# Patient Record
Sex: Female | Born: 1960
Health system: Southern US, Community
[De-identification: ages and names within clinical notes are randomized; demographics above are authoritative.]

## PROBLEM LIST (undated history)

## (undated) DIAGNOSIS — Z87442 Personal history of urinary calculi: Secondary | ICD-10-CM

## (undated) DIAGNOSIS — K589 Irritable bowel syndrome without diarrhea: Secondary | ICD-10-CM

## (undated) DIAGNOSIS — Z9889 Other specified postprocedural states: Secondary | ICD-10-CM

## (undated) DIAGNOSIS — IMO0002 Reserved for concepts with insufficient information to code with codable children: Secondary | ICD-10-CM

## (undated) DIAGNOSIS — K219 Gastro-esophageal reflux disease without esophagitis: Secondary | ICD-10-CM

## (undated) DIAGNOSIS — L719 Rosacea, unspecified: Secondary | ICD-10-CM

## (undated) DIAGNOSIS — R112 Nausea with vomiting, unspecified: Secondary | ICD-10-CM

## (undated) DIAGNOSIS — K649 Unspecified hemorrhoids: Secondary | ICD-10-CM

## (undated) DIAGNOSIS — Z8719 Personal history of other diseases of the digestive system: Secondary | ICD-10-CM

## (undated) DIAGNOSIS — K449 Diaphragmatic hernia without obstruction or gangrene: Secondary | ICD-10-CM

## (undated) HISTORY — DX: Rosacea, unspecified: L71.9

## (undated) HISTORY — DX: Unspecified hemorrhoids: K64.9

## (undated) HISTORY — DX: Diaphragmatic hernia without obstruction or gangrene: K44.9

## (undated) HISTORY — PX: COLONOSCOPY: SHX174

## (undated) HISTORY — DX: Gastro-esophageal reflux disease without esophagitis: K21.9

## (undated) HISTORY — PX: OTHER SURGICAL HISTORY: SHX169

## (undated) HISTORY — DX: Irritable bowel syndrome, unspecified: K58.9

## (undated) HISTORY — PX: ABDOMINAL HYSTERECTOMY: SHX81

## (undated) HISTORY — PX: EYE SURGERY: SHX253

## (undated) HISTORY — PX: LASIK: SHX215

## (undated) HISTORY — DX: Reserved for concepts with insufficient information to code with codable children: IMO0002

## (undated) HISTORY — PX: HERNIA REPAIR: SHX51

## (undated) HISTORY — PX: COSMETIC SURGERY: SHX468

---

## 2000-03-01 HISTORY — PX: LAPAROSCOPIC HYSTERECTOMY: SHX1926

## 2005-05-18 ENCOUNTER — Ambulatory Visit: Payer: Self-pay | Admitting: Unknown Physician Specialty

## 2007-06-14 ENCOUNTER — Emergency Department: Payer: Self-pay | Admitting: Emergency Medicine

## 2008-12-31 ENCOUNTER — Ambulatory Visit: Payer: Self-pay | Admitting: Unknown Physician Specialty

## 2009-01-14 ENCOUNTER — Ambulatory Visit: Payer: Self-pay | Admitting: Unknown Physician Specialty

## 2010-03-18 ENCOUNTER — Ambulatory Visit: Payer: Self-pay | Admitting: Unknown Physician Specialty

## 2010-04-03 ENCOUNTER — Ambulatory Visit: Payer: Self-pay | Admitting: Unknown Physician Specialty

## 2011-04-14 ENCOUNTER — Ambulatory Visit: Payer: Self-pay | Admitting: Unknown Physician Specialty

## 2011-04-15 ENCOUNTER — Ambulatory Visit: Payer: Self-pay | Admitting: Unknown Physician Specialty

## 2013-08-14 ENCOUNTER — Encounter: Payer: Self-pay | Admitting: *Deleted

## 2013-09-05 ENCOUNTER — Ambulatory Visit (INDEPENDENT_AMBULATORY_CARE_PROVIDER_SITE_OTHER): Payer: BC Managed Care – PPO | Admitting: General Surgery

## 2013-09-05 ENCOUNTER — Encounter: Payer: Self-pay | Admitting: General Surgery

## 2013-09-05 VITALS — BP 98/56 | HR 68 | Resp 12 | Ht 62.0 in | Wt 157.0 lb

## 2013-09-05 DIAGNOSIS — Z1211 Encounter for screening for malignant neoplasm of colon: Secondary | ICD-10-CM

## 2013-09-05 MED ORDER — POLYETHYLENE GLYCOL 3350 17 GM/SCOOP PO POWD: ORAL | Status: DC

## 2013-09-05 NOTE — Patient Instructions (Addendum)
Colonoscopy A colonoscopy is an exam to look at the entire large intestine (colon). This exam can help find problems such as tumors, polyps, inflammation, and areas of bleeding. The exam takes about 1 hour.  LET [facility]ARE PROVIDER KNOW ABOUT:   Any allergies you have.  All medicines you are taking, including vitamins, herbs, eye drops, creams, and over-the-counter medicines.  Previous problems you or members of your family have had with the use of anesthetics.  Any blood disorders you have.  Previous surgeries you have had.  Medical conditions you have. RISKS AND COMPLICATIONS  Generally, this is a safe procedure. However, as with any procedure, complications can occur. Possible complications include:  Bleeding.  Tearing or rupture of the colon wall.  Reaction to medicines given during the exam.  Infection (rare). BEFORE THE PROCEDURE   Ask your health care provider about changing or stopping your regular medicines.  You may be prescribed an oral bowel prep. This involves drinking a large amount of medicated liquid, starting the day before your procedure. The liquid will cause you to have multiple loose stools until your stool is almost clear or light green. This cleans out your colon in preparation for the procedure.  Do not eat or drink anything else once you have started the bowel prep, unless your health care provider tells you it is safe to do so.  Arrange for someone to drive you home after the procedure. PROCEDURE   You will be given medicine to help you relax (sedative).  You will lie on your side with your knees bent.  A long, flexible tube with a light and camera on the end (colonoscope) will be inserted through the rectum and into the colon. The camera sends video back to a computer screen as it moves through the colon. The colonoscope also releases carbon dioxide gas to inflate the colon. This helps your health care provider see the area better.  During  the exam, your health care provider may take a small tissue sample (biopsy) to be examined under a microscope if any abnormalities are found.  The exam is finished when the entire colon has been viewed. AFTER THE PROCEDURE   Do not drive for 24 hours after the exam.  You may have a small amount of blood in your stool.  You may pass moderate amounts of gas and have mild abdominal cramping or bloating. This is caused by the gas used to inflate your colon during the exam.  Ask when your test results will be ready and how you will get your results. Make sure you get your test results. Document Released: 02/13/2000 Document Revised: 12/06/2012 Document Reviewed: 10/23/2012 [facility] Patient Information 2015 [employer] [place] [place]. This information is not intended to replace advice given to you by your health care provider. Make sure you discuss any questions you have with your health care provider.  Patient has been scheduled [facility] a colon[facility] on 09[facility]-13 [facility].

## 2013-09-05 NOTE — Progress Notes (Signed)
Patient ID: Sharon Branch, female   DOB: 04/23/1960, 53 y.o.   MRN: [id number]  Chief Complaint  Patient presents with  . Colonoscopy    HPI Sharon Branch is a 53 y.o. female.  Here today to discuss having a colonoscopy, none prior. Denies any gastrointestinal issues. She does admit to some rectal bleeding for about 3-4 days from a hemorrhoid about 3-4 weeks ago. This may have happened in the past but not often, no other bleeding since. Denies family history of colon cancer or polys. Bowels move about 1-2 times daily.   HPI  Past Medical History  Diagnosis Date  . IBS (irritable bowel syndrome)   . GERD (gastroesophageal reflux disease)   . Hemorrhoid   . Rosacea     Past Surgical History  Procedure Laterality Date  . Cesarean section  1993  . Laparoscopic hysterectomy  2002    No family history on file.  Social History History  Substance Use Topics  . Smoking status: Never Smoker   . Smokeless tobacco: Never Used  . Alcohol Use: No    Allergies  Allergen Reactions  . Codeine Nausea And Vomiting    Current Outpatient Prescriptions  Medication Sig Dispense Refill  . doxycycline (ORACEA) 40 MG capsule Take 40 mg by mouth every morning.      Marland Kitchen. omeprazole (PRILOSEC) 20 MG capsule Take 20 mg by mouth daily.      . polyethylene glycol powder (GLYCOLAX/MIRALAX) powder 255 grams one bottle for colonoscopy prep  255 g  0   No current facility-administered medications for this visit.    Review of Systems Review of Systems  Constitutional: Negative.   Respiratory: Negative.   Cardiovascular: Negative.   Gastrointestinal: Positive for anal bleeding.    Blood pressure 98/56, pulse 68, resp. rate 12, height 5\' 2"  (1.575 m), weight 157 lb (71.215 kg).  Physical Exam Physical Exam  Constitutional: She is oriented to person, place, and time. She appears well-developed and well-nourished.  Neck: Neck supple.  Cardiovascular: Normal rate, regular rhythm and normal heart  sounds.   Pulmonary/Chest: Effort normal and breath sounds normal.  Lymphadenopathy:    She has no cervical adenopathy.  Neurological: She is alert and oriented to person, place, and time.  Skin: Skin is warm and dry.    Data Reviewed Notes from Dr. Terald SleeperVentolin  Assessment    Candidate for screening colonoscopy.    Plan      Colonoscopy with possible biopsy/polypectomy prn: Information regarding the procedure, including its potential risks and complications (including but not limited to perforation of the bowel, which may require emergency surgery to repair, and bleeding) was verbally given to the patient. Educational information regarding lower instestinal endoscopy was given to the patient. Written instructions for how to complete the bowel prep using Miralax were provided. The importance of drinking ample fluids to avoid dehydration as a result of the prep emphasized.  Patient has been scheduled [facility].     PCP none Ref: Etheleen MayhewVanDalen, Robert  Jetson Pickrel W 09/07/2013, 2:19 PM

## 2013-09-07 DIAGNOSIS — Z1211 Encounter for screening for malignant neoplasm of colon: Secondary | ICD-10-CM | POA: Insufficient documentation

## 2013-09-19 ENCOUNTER — Telehealth: Payer: Self-pay | Admitting: *Deleted

## 2013-09-19 NOTE — Telephone Encounter (Signed)
Per Delice Bisonara at [facility], they do not have enough cases scheduled for 09-25-13 and will need to reschedule this patient's colonoscopy. This case has been moved from 09-25-13 [facility]. Patient aware of date change and verbalizes understanding.

## 2013-10-10 DIAGNOSIS — Z1211 Encounter for screening for malignant neoplasm of colon: Secondary | ICD-10-CM

## 2013-10-10 LAB — HM COLONOSCOPY

## 2013-12-31 ENCOUNTER — Encounter: Payer: Self-pay | Admitting: General Surgery

## 2015-01-16 ENCOUNTER — Other Ambulatory Visit: Payer: Self-pay | Admitting: Student

## 2015-01-16 DIAGNOSIS — R1013 Epigastric pain: Secondary | ICD-10-CM

## 2015-01-16 DIAGNOSIS — R748 Abnormal levels of other serum enzymes: Secondary | ICD-10-CM

## 2015-01-27 ENCOUNTER — Ambulatory Visit
Admission: RE | Admit: 2015-01-27 | Discharge: 2015-01-27 | Disposition: A | Discharge status: Home / Self Care | Payer: BLUE CROSS/BLUE SHIELD | Source: Ambulatory Visit | Attending: Student | Admitting: Student

## 2015-01-27 DIAGNOSIS — R748 Abnormal levels of other serum enzymes: Secondary | ICD-10-CM | POA: Insufficient documentation

## 2015-01-27 DIAGNOSIS — R1013 Epigastric pain: Secondary | ICD-10-CM

## 2016-07-20 NOTE — Telephone Encounter (Signed)
This encounter was created in error - please disregard.

## 2016-08-17 DIAGNOSIS — L718 Other rosacea: Secondary | ICD-10-CM | POA: Diagnosis not present

## 2016-11-02 ENCOUNTER — Telehealth: Payer: Self-pay | Admitting: Family Medicine

## 2016-11-26 NOTE — Telephone Encounter (Signed)
Error

## 2017-01-17 DIAGNOSIS — K3189 Other diseases of stomach and duodenum: Secondary | ICD-10-CM | POA: Diagnosis not present

## 2017-01-17 DIAGNOSIS — K2970 Gastritis, unspecified, without bleeding: Secondary | ICD-10-CM | POA: Diagnosis not present

## 2017-01-17 DIAGNOSIS — R1013 Epigastric pain: Secondary | ICD-10-CM | POA: Diagnosis not present

## 2017-02-17 ENCOUNTER — Other Ambulatory Visit: Payer: Self-pay | Admitting: Student

## 2017-02-17 DIAGNOSIS — R1013 Epigastric pain: Secondary | ICD-10-CM

## 2017-02-17 DIAGNOSIS — R1310 Dysphagia, unspecified: Secondary | ICD-10-CM

## 2017-03-07 ENCOUNTER — Ambulatory Visit: Payer: BLUE CROSS/BLUE SHIELD | Attending: Student

## 2017-04-24 DIAGNOSIS — R509 Fever, unspecified: Secondary | ICD-10-CM | POA: Diagnosis not present

## 2017-04-24 DIAGNOSIS — J09X2 Influenza due to identified novel influenza A virus with other respiratory manifestations: Secondary | ICD-10-CM | POA: Diagnosis not present

## 2017-04-24 DIAGNOSIS — J029 Acute pharyngitis, unspecified: Secondary | ICD-10-CM | POA: Diagnosis not present

## 2017-11-08 DIAGNOSIS — L718 Other rosacea: Secondary | ICD-10-CM | POA: Diagnosis not present

## 2018-01-18 ENCOUNTER — Telehealth: Payer: Self-pay

## 2018-01-18 NOTE — Telephone Encounter (Signed)
Pt calling requesting a RX for nerves/help her sleep. I tried to call pt, no answer. Can you try in a bit to call her and get an appt scheduled?it's been over a year since we have seen her here. Thank you

## 2018-01-19 NOTE — Telephone Encounter (Signed)
Patient is schedule 01/25/18 with AMS

## 2018-01-25 ENCOUNTER — Other Ambulatory Visit (HOSPITAL_COMMUNITY)
Admission: RE | Admit: 2018-01-25 | Discharge: 2018-01-25 | Disposition: A | Discharge status: Home / Self Care | Payer: BLUE CROSS/BLUE SHIELD | Source: Ambulatory Visit | Attending: Obstetrics and Gynecology | Admitting: Obstetrics and Gynecology

## 2018-01-25 ENCOUNTER — Ambulatory Visit (INDEPENDENT_AMBULATORY_CARE_PROVIDER_SITE_OTHER): Payer: BLUE CROSS/BLUE SHIELD | Admitting: Obstetrics and Gynecology

## 2018-01-25 ENCOUNTER — Encounter: Payer: Self-pay | Admitting: Obstetrics and Gynecology

## 2018-01-25 VITALS — BP 127/79 | HR 71 | Ht 61.0 in | Wt 130.0 lb

## 2018-01-25 DIAGNOSIS — Z124 Encounter for screening for malignant neoplasm of cervix: Secondary | ICD-10-CM | POA: Diagnosis not present

## 2018-01-25 DIAGNOSIS — F418 Other specified anxiety disorders: Secondary | ICD-10-CM

## 2018-01-25 DIAGNOSIS — Z01419 Encounter for gynecological examination (general) (routine) without abnormal findings: Secondary | ICD-10-CM | POA: Diagnosis not present

## 2018-01-25 DIAGNOSIS — Z1239 Encounter for other screening for malignant neoplasm of breast: Secondary | ICD-10-CM

## 2018-01-25 MED ORDER — ESCITALOPRAM OXALATE 10 MG PO TABS: 10.0000 mg | ORAL_TABLET | Freq: Every day | ORAL | 3 refills | Status: DC

## 2018-01-25 MED ORDER — HYDROXYZINE HCL 25 MG PO TABS: 25.0000 mg | ORAL_TABLET | Freq: Four times a day (QID) | ORAL | 2 refills | Status: DC | PRN

## 2018-01-25 NOTE — Progress Notes (Signed)
Gynecology Annual Exam  PCP: Patient, No Pcp Per  Chief Complaint:  Chief Complaint  Patient presents with  . Gynecologic Exam    Anxiety going through divorce    History of Present Illness:Patient is a 57 y.o. G2P2 presents for annual exam. The patient has no complaints today.   LMP: No LMP recorded. Patient has had a hysterectomy.  The patient is not currently sexually active.  The patient does perform self breast exams.  There is no notable family history of breast or ovarian cancer in her family.  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 Kyla BalzarineSoper in the past back [facility]    The patient wears seatbelts: yes.   The patient has regular exercise: not asked.    The patient reports current symptoms of depression.     The patient is a 57 y.o. female presenting initial evaluation for symptoms of anxiety and depression.  The patient is currently taking nothing for the management of her symptoms.  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  She reports symptoms of anhedonia, insomnia, irritability, decreased appetite and social anxiety.  She denies risk taking behavior, increased appetite, suicidal ideation, homicidal ideation, auditory hallucinations and visual hallucinations.      The patient does not have a pre-existing history of depression and anxiety.  She  does not a prior history of suicide attempts.    Review of Systems: Review of Systems  Constitutional: Positive for weight loss. Negative for chills, diaphoresis, fever and malaise/fatigue.  HENT: Negative for congestion.   Respiratory: Negative for cough and shortness of breath.   Cardiovascular: Negative for chest pain and palpitations.  Gastrointestinal: Negative for abdominal pain, constipation, diarrhea, heartburn, nausea and vomiting.  Genitourinary: Negative for dysuria, frequency and urgency.  Skin: Negative for itching and  rash.  Neurological: Negative for dizziness and headaches.  Endo/Heme/Allergies: Negative for polydipsia.  Psychiatric/Behavioral: Positive for depression. The patient is nervous/anxious and has insomnia.     Past Medical History:  Past Medical History:  Diagnosis Date  . GERD (gastroesophageal reflux disease)   . Hemorrhoid   . IBS (irritable bowel syndrome)   . Rosacea     Past Surgical History:  Past Surgical History:  Procedure Laterality Date  . CESAREAN SECTION  1993  . LAPAROSCOPIC HYSTERECTOMY  2002    Gynecologic History:  No LMP recorded. Patient has had a hysterectomy. History of VAIN followed by Dr. Kyla BalzarineSoper in the past  Obstetric History: G2P2  Family History:  History reviewed. No pertinent family history.  Social History:  Social History   Socioeconomic History  . Marital status: Married    Spouse name: Not on file  . Number of children: Not on file  . Years of education: Not on file  . Highest education level: Not on file  Occupational History  . Not on file  Social Needs  . Financial resource strain: Not on file  . Food insecurity:    Worry: Not on file    Inability: Not on file  . Transportation needs:    Medical: Not on file    Non-medical: Not on file  Tobacco Use  . Smoking status: Never Smoker  . Smokeless tobacco: Never Used  Substance and Sexual Activity  . Alcohol use: No  . Drug use: No  . Sexual activity: Not on file  Lifestyle  . Physical activity:  Days per week: Not on file    Minutes per session: Not on file  . Stress: Not on file  Relationships  . Social connections:    Talks on phone: Not on file    Gets together: Not on file    Attends religious service: Not on file    Active member of club or organization: Not on file    Attends meetings of clubs or organizations: Not on file    Relationship status: Not on file  . Intimate partner violence:    Fear of current or ex partner: Not on file    Emotionally abused: Not  on file    Physically abused: Not on file    Forced sexual activity: Not on file  Other Topics Concern  . Not on file  Social History Narrative  . Not on file    Allergies:  Allergies  Allergen Reactions  . Codeine Nausea And Vomiting    Medications: Prior to Admission medications   Medication Sig Start Date End Date Taking? Authorizing Provider  doxycycline (ORACEA) 40 MG capsule Take 40 mg by mouth every morning.    [provider]  omeprazole (PRILOSEC) 20 MG capsule Take 20 mg by mouth daily.    [provider]  polyethylene glycol powder (GLYCOLAX/MIRALAX) powder 255 grams one bottle for colonoscopy prep 09/05/13   Earline Mayotte, MD    Physical Exam Vitals: Blood pressure 127/79, pulse 71, height 5\' 1"  (1.549 m), weight 130 lb (59 kg). .  General: NAD HEENT: normocephalic, anicteric Thyroid: no enlargement, no palpable nodules Pulmonary: No increased work of breathing, CTAB Cardiovascular: RRR, distal pulses 2+ Breast: Breast symmetrical, no tenderness, no palpable nodules or masses, no skin or nipple retraction present, no nipple discharge.  No axillary or supraclavicular lymphadenopathy. Abdomen: NABS, soft, non-tender, non-distended.  Umbilicus without lesions.  No hepatomegaly, splenomegaly or masses palpable. No evidence of hernia  Genitourinary:  External: Normal external female genitalia.  Normal urethral meatus, normal Bartholin's and Skene's glands.    Vagina: Normal vaginal mucosa, no evidence of prolapse.    Cervix: surgically absent  Uterus: surgically absent  Adnexa: ovaries non-enlarged, no adnexal masses  Rectal: deferred  Lymphatic: no evidence of inguinal lymphadenopathy Extremities: no edema, erythema, or tenderness Neurologic: Grossly intact Psychiatric: mood appropriate, affect full  Female chaperone present for pelvic and breast  portions of the physical exam     Assessment: 57 y.o. G2P2 routine annual  exam  Plan: Problem List Items Addressed This Visit    None    Visit Diagnoses    Encounter for gynecological examination without abnormal finding    -  Primary   Screening for malignant neoplasm of cervix       Relevant Orders   Cytology - PAP   Breast screening       Relevant Orders   MM 3D SCREEN BREAST BILATERAL      1) Mammogram - recommend yearly screening mammogram.  Mammogram Was ordered today  2) STI screening  was notoffered and therefore not obtained  3) ASCCP guidelines and rational discussed.   screening interval - pap given history of VAIN in past unclear if prior CIN II or greater  4) Osteoporosis  - per USPTF routine screening DEXA at age 14  5) Routine healthcare maintenance including cholesterol, diabetes screening discussed managed by PCP  6) Colonoscopy UTD  Anxiety depression - situational divorce PHQ-9 is 10 item 9 is 0, GAD-7 is 9 - vistaril - lexapro  10mg   7) Return in about 4 years (around 01/25/2022) for 4-6 weeks medication follow up.    Vena Austria, MD Domingo Pulse, [facility] 01/25/2018, 4:28 PM

## 2018-01-25 NOTE — Patient Instructions (Signed)
[facility] [address] [place] [address]  [facility] [place]  [address] [place] [address]  Phone: [phone]  

## 2018-01-31 LAB — CYTOLOGY - PAP: Diagnosis: NEGATIVE

## 2018-03-22 ENCOUNTER — Other Ambulatory Visit: Payer: Self-pay | Admitting: Obstetrics and Gynecology

## 2018-03-22 MED ORDER — ESCITALOPRAM OXALATE 10 MG PO TABS: 10.0000 mg | ORAL_TABLET | Freq: Every day | ORAL | 3 refills | Status: DC

## 2018-03-22 NOTE — Telephone Encounter (Signed)
Reordered if she can make a follow up visit in the next 2-4 weeks to assess how she is doing

## 2018-03-22 NOTE — Telephone Encounter (Signed)
Last appointment 11/27. No future appointments scheduled

## 2018-04-19 ENCOUNTER — Encounter: Payer: Self-pay | Admitting: Obstetrics and Gynecology

## 2018-04-19 ENCOUNTER — Ambulatory Visit (INDEPENDENT_AMBULATORY_CARE_PROVIDER_SITE_OTHER): Payer: BLUE CROSS/BLUE SHIELD | Admitting: Obstetrics and Gynecology

## 2018-04-19 VITALS — BP 116/72 | HR 73 | Ht 61.0 in | Wt 123.0 lb

## 2018-04-19 DIAGNOSIS — F329 Major depressive disorder, single episode, unspecified: Secondary | ICD-10-CM

## 2018-04-19 DIAGNOSIS — F32A Depression, unspecified: Secondary | ICD-10-CM

## 2018-04-19 DIAGNOSIS — F419 Anxiety disorder, unspecified: Secondary | ICD-10-CM

## 2018-04-19 MED ORDER — ESCITALOPRAM OXALATE 10 MG PO TABS: 10.0000 mg | ORAL_TABLET | Freq: Every day | ORAL | 3 refills | Status: DC

## 2018-04-19 NOTE — Progress Notes (Signed)
Obstetrics & Gynecology Office Visit   Chief Complaint:  Chief Complaint  Patient presents with  . Follow-up    Medication    History of Present Illness: The patient is a 58 y.o. female presenting follow up for symptoms of anxiety and depression.  The patient is currently taking Lexapro 10mg  for the management of her symptoms.  She has not had any recent situational stressors.  She reports complete resolution of symptoms, no breakthrough panic attacks.  She denies anhedonia, day time somnolence, insomnia, risk taking behavior, irritability, social anxiety, agorophobia, feelings of guilt, feelings of worthlessness, suicidal ideation, homicidal ideation, auditory hallucinations and visual hallucinations. Symptoms have improved since last visit.     No side-effects noted per patient.  First week had some nausea which resolved.  Review of Systems: Review of Systems  Constitutional: Negative.   Gastrointestinal: Negative for nausea.  Skin: Negative.   Psychiatric/Behavioral: Negative.    Past Medical History:  Past Medical History:  Diagnosis Date  . GERD (gastroesophageal reflux disease)   . Hemorrhoid   . IBS (irritable bowel syndrome)   . Rosacea     Past Surgical History:  Past Surgical History:  Procedure Laterality Date  . CESAREAN SECTION  1993  . LAPAROSCOPIC HYSTERECTOMY  2002    Gynecologic History: No LMP recorded. Patient has had a hysterectomy.  Obstetric History: G2P2  Family History:  History reviewed. No pertinent family history.  Social History:  Social History   Socioeconomic History  . Marital status: Married    Spouse name: Not on file  . Number of children: Not on file  . Years of education: Not on file  . Highest education level: Not on file  Occupational History  . Not on file  Social Needs  . Financial resource strain: Not on file  . Food insecurity:    Worry: Not on file    Inability: Not on file  . Transportation needs:   Medical: Not on file    Non-medical: Not on file  Tobacco Use  . Smoking status: Never Smoker  . Smokeless tobacco: Never Used  Substance and Sexual Activity  . Alcohol use: No  . Drug use: No  . Sexual activity: Not on file  Lifestyle  . Physical activity:    Days per week: Not on file    Minutes per session: Not on file  . Stress: Not on file  Relationships  . Social connections:    Talks on phone: Not on file    Gets together: Not on file    Attends religious service: Not on file    Active member of club or organization: Not on file    Attends meetings of clubs or organizations: Not on file    Relationship status: Not on file  . Intimate partner violence:    Fear of current or ex partner: Not on file    Emotionally abused: Not on file    Physically abused: Not on file    Forced sexual activity: Not on file  Other Topics Concern  . Not on file  Social History Narrative  . Not on file    Allergies:  Allergies  Allergen Reactions  . Codeine Nausea And Vomiting    Medications: Prior to Admission medications   Medication Sig Start Date End Date Taking? Authorizing Provider  doxycycline (ORACEA) 40 MG capsule Take 40 mg by mouth every morning.   Yes [provider]  escitalopram (LEXAPRO) 10 MG tablet Take 1  tablet (10 mg total) by mouth daily. 03/22/18  Yes Vena Austria, MD  hydrOXYzine (ATARAX/VISTARIL) 25 MG tablet Take 1 tablet (25 mg total) by mouth every 6 (six) hours as needed for anxiety. 01/25/18  Yes Vena Austria, MD  polyethylene glycol powder [facility]) powder 255 grams one bottle for colonoscopy prep 09/05/13  Yes Byrnett, Merrily Pew, MD  omeprazole (PRILOSEC) 20 MG capsule Take 20 mg by mouth daily.    [provider]    Physical Exam Vitals:  Vitals:   04/19/18 1346  BP: 116/72  Pulse: 73   No LMP recorded. Patient has had a hysterectomy.  General: NAD HEENT: normocephalic, anicteric Pulmonary: No increased work  of breathing Neurologic: Grossly intact Psychiatric: mood appropriate, affect full    GAD 7 : Generalized Anxiety Score 04/19/2018 01/25/2018  Nervous, Anxious, on Edge 0 2  Control/stop worrying 0 1  Worry too much - different things 1 1  Trouble relaxing 0 2  Restless 0 1  Easily annoyed or irritable 1 1  Afraid - awful might happen 1 0  Total GAD 7 Score 3 8  Anxiety Difficulty Not difficult at all Very difficult    Depression screen [facility] 2/9 04/19/2018 01/25/2018  Decreased Interest 1 1  Down, Depressed, Hopeless 0 2  PHQ - 2 Score 1 3  Altered sleeping 2 2  Tired, decreased energy 1 2  Change in appetite 0 3  Feeling bad or failure about yourself  0 0  Trouble concentrating 0 0  Moving slowly or fidgety/restless 0 0  Suicidal thoughts 0 0  PHQ-9 Score 4 10  Difficult doing work/chores Not difficult at all Somewhat difficult    Depression screen [facility] 2/9 04/19/2018 01/25/2018  Decreased Interest 1 1  Down, Depressed, Hopeless 0 2  PHQ - 2 Score 1 3  Altered sleeping 2 2  Tired, decreased energy 1 2  Change in appetite 0 3  Feeling bad or failure about yourself  0 0  Trouble concentrating 0 0  Moving slowly or fidgety/restless 0 0  Suicidal thoughts 0 0  PHQ-9 Score 4 10  Difficult doing work/chores Not difficult at all Somewhat difficult     Assessment: 58 y.o. G2P2 follow up anxiety and depression with anxiety being the main symptom  Plan: Problem List Items Addressed This Visit    None    Visit Diagnoses    Anxiety and depression    -  Primary   Relevant Medications   escitalopram (LEXAPRO) 10 MG tablet      1) Anxiety/Depression - good response to Lexapro 10mg .  Continue at current dose. - switch to 90 day supply to promote adherence - discussed should she note worsening or return of symptoms at any point to contact office and we would work her in  2) Thyroid and B12 screen has not been obtained previously  3) Return in about 9 months (around  01/18/2019) for annual.    Vena Austria, MD, [facility], [facility] 04/19/2018, 2:04 PM

## 2018-10-24 DIAGNOSIS — D225 Melanocytic nevi of trunk: Secondary | ICD-10-CM | POA: Diagnosis not present

## 2018-10-24 DIAGNOSIS — D2262 Melanocytic nevi of left upper limb, including shoulder: Secondary | ICD-10-CM | POA: Diagnosis not present

## 2018-10-24 DIAGNOSIS — L218 Other seborrheic dermatitis: Secondary | ICD-10-CM | POA: Diagnosis not present

## 2018-10-24 DIAGNOSIS — L718 Other rosacea: Secondary | ICD-10-CM | POA: Diagnosis not present

## 2018-12-06 DIAGNOSIS — M79671 Pain in right foot: Secondary | ICD-10-CM | POA: Diagnosis not present

## 2018-12-06 DIAGNOSIS — M79672 Pain in left foot: Secondary | ICD-10-CM | POA: Diagnosis not present

## 2018-12-06 DIAGNOSIS — M7741 Metatarsalgia, right foot: Secondary | ICD-10-CM | POA: Diagnosis not present

## 2018-12-06 DIAGNOSIS — G5782 Other specified mononeuropathies of left lower limb: Secondary | ICD-10-CM | POA: Diagnosis not present

## 2018-12-27 DIAGNOSIS — G5782 Other specified mononeuropathies of left lower limb: Secondary | ICD-10-CM | POA: Diagnosis not present

## 2018-12-27 DIAGNOSIS — M7741 Metatarsalgia, right foot: Secondary | ICD-10-CM | POA: Diagnosis not present

## 2018-12-27 DIAGNOSIS — M79672 Pain in left foot: Secondary | ICD-10-CM | POA: Diagnosis not present

## 2018-12-27 DIAGNOSIS — M216X1 Other acquired deformities of right foot: Secondary | ICD-10-CM | POA: Diagnosis not present

## 2018-12-27 DIAGNOSIS — M2041 Other hammer toe(s) (acquired), right foot: Secondary | ICD-10-CM | POA: Diagnosis not present

## 2018-12-27 DIAGNOSIS — M216X2 Other acquired deformities of left foot: Secondary | ICD-10-CM | POA: Diagnosis not present

## 2019-04-08 ENCOUNTER — Other Ambulatory Visit: Payer: Self-pay | Admitting: Obstetrics and Gynecology

## 2019-04-09 NOTE — Telephone Encounter (Signed)
Advise

## 2019-07-02 ENCOUNTER — Other Ambulatory Visit: Payer: Self-pay | Admitting: Obstetrics and Gynecology

## 2019-08-02 ENCOUNTER — Other Ambulatory Visit: Payer: Self-pay | Admitting: Obstetrics and Gynecology

## 2019-08-02 ENCOUNTER — Telehealth: Payer: Self-pay | Admitting: Obstetrics and Gynecology

## 2019-08-02 ENCOUNTER — Other Ambulatory Visit: Payer: Self-pay

## 2019-08-02 MED ORDER — ESCITALOPRAM OXALATE 10 MG PO TABS: 10.0000 mg | ORAL_TABLET | Freq: Every day | ORAL | 0 refills | Status: DC

## 2019-08-02 NOTE — Telephone Encounter (Signed)
Rx has been sent  

## 2019-08-02 NOTE — Telephone Encounter (Signed)
See pt msg

## 2019-08-02 NOTE — Telephone Encounter (Signed)
Advise

## 2019-08-02 NOTE — Telephone Encounter (Signed)
PT HAS 5 PILLS LEFT ON HER LEXAPRO. SHE IS SCHEDULED IN [place] NEXT WEEK FOR EAN AND MED FOLLOW UP. CAN A FEW BE CALLED IN TO GET TO HER APPOINTMENT?  PLEASE ADVISE

## 2019-08-03 NOTE — Telephone Encounter (Signed)
Pt is aware Rx was sent in.  

## 2019-08-09 ENCOUNTER — Other Ambulatory Visit: Payer: Self-pay

## 2019-08-09 ENCOUNTER — Encounter: Payer: Self-pay | Admitting: Obstetrics and Gynecology

## 2019-08-09 ENCOUNTER — Ambulatory Visit: Payer: BLUE CROSS/BLUE SHIELD | Admitting: Obstetrics and Gynecology

## 2019-08-09 VITALS — BP 121/79 | HR 59 | Ht 61.0 in | Wt 127.0 lb

## 2019-08-09 DIAGNOSIS — Z1239 Encounter for other screening for malignant neoplasm of breast: Secondary | ICD-10-CM

## 2019-08-09 DIAGNOSIS — Z01419 Encounter for gynecological examination (general) (routine) without abnormal findings: Secondary | ICD-10-CM

## 2019-08-09 MED ORDER — ESCITALOPRAM OXALATE 10 MG PO TABS: 10.0000 mg | ORAL_TABLET | Freq: Every day | ORAL | 3 refills | Status: DC

## 2019-08-09 NOTE — Patient Instructions (Signed)
[facility] [address] [place] [address]  [facility] [place]  [address] [place] [address]  Phone: [phone]  

## 2019-08-09 NOTE — Progress Notes (Signed)
Gynecology Annual Exam  PCP: Sharon Branch, No Pcp Per  Chief Complaint:  Chief Complaint  Sharon Branch presents with  . Gynecologic Exam    History of Present Illness:Sharon Branch is a 59 y.o. G2P2 presents for annual exam. The Sharon Branch has no complaints today.   LMP: No LMP recorded. Sharon Branch has had a hysterectomy.   The Sharon Branch is not currently sexually active.  The Sharon Branch does perform self breast exams.  There is no notable family history of breast or ovarian cancer in her family.  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 Clarene Essex in the past back when he was at [facility].    The Sharon Branch wears seatbelts: yes.   The Sharon Branch has regular exercise: not asked.    The Sharon Branch denies current symptoms of depression.   Doing well on current dose of Lexapro.  Review of Systems: Review of Systems  Constitutional: Negative for chills and fever.  HENT: Negative for congestion.   Respiratory: Negative for cough and shortness of breath.   Cardiovascular: Negative for chest pain and palpitations.  Gastrointestinal: Negative for abdominal pain, constipation, diarrhea, heartburn, nausea and vomiting.  Genitourinary: Negative for dysuria, frequency and urgency.  Skin: Negative for itching and rash.  Neurological: Negative for dizziness and headaches.  Endo/Heme/Allergies: Negative for polydipsia.  Psychiatric/Behavioral: Negative for depression.    Past Medical History:  Sharon Branch Active Problem List   Diagnosis Date Noted  . Encounter for screening colonoscopy 09/07/2013    Past Surgical History:  Past Surgical History:  Procedure Laterality Date  . CESAREAN SECTION  1993  . LAPAROSCOPIC HYSTERECTOMY  2002    Gynecologic History:  No LMP recorded. Sharon Branch has had a hysterectomy. Last Pap: Results were: 01/25/2018 NIL and HR HPV negative   Obstetric History: G2P2  Family History:  History reviewed. No pertinent family history.  Social History:  Social History   Socioeconomic  History  . Marital status: Married    Spouse name: Not on file  . Number of children: Not on file  . Years of education: Not on file  . Highest education level: Not on file  Occupational History  . Not on file  Tobacco Use  . Smoking status: Never Smoker  . Smokeless tobacco: Never Used  Substance and Sexual Activity  . Alcohol use: No  . Drug use: No  . Sexual activity: Not Currently  Other Topics Concern  . Not on file  Social History Narrative  . Not on file   Social Determinants of Health   Financial Resource Strain:   . Difficulty of Paying Living Expenses:   Food Insecurity:   . Worried About Charity fundraiser in the Last Year:   . Arboriculturist in the Last Year:   Transportation Needs:   . Film/video editor (Medical):   Marland Kitchen Lack of Transportation (Non-Medical):   Physical Activity:   . Days of Exercise per Week:   . Minutes of Exercise per Session:   Stress:   . Feeling of Stress :   Social Connections:   . Frequency of Communication with Friends and Family:   . Frequency of Social Gatherings with Friends and Family:   . Attends Religious Services:   . Active Member of Clubs or Organizations:   . Attends Archivist Meetings:   Marland Kitchen Marital Status:   Intimate Partner Violence:   . Fear of Current or Ex-Partner:   . Emotionally Abused:   Marland Kitchen Physically Abused:   .  Sexually Abused:     Allergies:  Allergies  Allergen Reactions  . Codeine Nausea And Vomiting    Medications: Prior to Admission medications   Medication Sig Start Date End Date Taking? Authorizing Provider  doxycycline (ORACEA) 40 MG capsule Take 40 mg by mouth every morning.   Yes [provider]  escitalopram (LEXAPRO) 10 MG tablet Take 1 tablet (10 mg total) by mouth daily. 08/02/19  Yes Vena Austria, MD    Physical Exam Vitals: Blood pressure 121/79, pulse (!) 59, height 5\' 1"  (1.549 m), weight 127 lb (57.6 kg).  General: NAD HEENT: normocephalic,  anicteric Thyroid: no enlargement, no palpable nodules Pulmonary: No increased work of breathing, CTAB Cardiovascular: RRR, distal pulses 2+ Breast: Breast symmetrical, no tenderness, no palpable nodules or masses, no skin or nipple retraction present, no nipple discharge.  No axillary or supraclavicular lymphadenopathy. Abdomen: NABS, soft, non-tender, non-distended.  Umbilicus without lesions.  No hepatomegaly, splenomegaly or masses palpable. No evidence of hernia  Genitourinary:  External: Normal external female genitalia.  Normal urethral meatus, normal Bartholin's and Skene's glands.    Vagina: Normal vaginal mucosa, no evidence of prolapse.    Cervix: surgically absent  Uterus: surgically absent  Adnexa: ovaries non-enlarged, no adnexal masses  Rectal: deferred  Lymphatic: no evidence of inguinal lymphadenopathy Extremities: no edema, erythema, or tenderness Neurologic: Grossly intact Psychiatric: mood appropriate, affect full  Female chaperone present for pelvic and breast  portions of the physical exam     Assessment: 59 y.o. G2P2 routine annual exam  Plan: Problem List Items Addressed This Visit    None    Visit Diagnoses    Encounter for gynecological examination without abnormal finding    -  Primary   Breast screening       Relevant Orders   MM 3D SCREEN BREAST BILATERAL      1) Mammogram - recommend yearly screening mammogram.  Mammogram Was ordered today  2) STI screening  was notoffered and therefore not obtained  3) ASCCP guidelines and rational discussed.  Sharon Branch opts for every 3 years screening interval - continue q3years given history of VAIN 4) Osteoporosis  - per USPTF routine screening DEXA at age 73  5) Routine healthcare maintenance including cholesterol, diabetes screening discussed managed by PCP  6) Colonoscopy - UTD  7) Return in about 1 year (around 08/08/2020) for annual.    10/08/2020, MD Vena Austria, [facility] 08/09/2019, 3:36 PM

## 2019-08-22 NOTE — Telephone Encounter (Signed)
Can this be fixed so we can close out this message. Wont allow [place] to close it because it's unfinished. Thanks

## 2019-12-14 DIAGNOSIS — D225 Melanocytic nevi of trunk: Secondary | ICD-10-CM | POA: Diagnosis not present

## 2019-12-14 DIAGNOSIS — D2262 Melanocytic nevi of left upper limb, including shoulder: Secondary | ICD-10-CM | POA: Diagnosis not present

## 2019-12-14 DIAGNOSIS — L718 Other rosacea: Secondary | ICD-10-CM | POA: Diagnosis not present

## 2019-12-14 DIAGNOSIS — L309 Dermatitis, unspecified: Secondary | ICD-10-CM | POA: Diagnosis not present

## 2020-01-10 ENCOUNTER — Ambulatory Visit: Payer: BC Managed Care – PPO | Admitting: Podiatry

## 2020-01-14 DIAGNOSIS — L719 Rosacea, unspecified: Secondary | ICD-10-CM | POA: Insufficient documentation

## 2020-01-15 ENCOUNTER — Ambulatory Visit: Payer: BC Managed Care – PPO | Admitting: Podiatry

## 2020-01-15 ENCOUNTER — Other Ambulatory Visit: Payer: Self-pay

## 2020-01-15 ENCOUNTER — Encounter: Payer: Self-pay | Admitting: Podiatry

## 2020-01-15 ENCOUNTER — Ambulatory Visit (INDEPENDENT_AMBULATORY_CARE_PROVIDER_SITE_OTHER): Payer: BC Managed Care – PPO

## 2020-01-15 DIAGNOSIS — M779 Enthesopathy, unspecified: Secondary | ICD-10-CM | POA: Diagnosis not present

## 2020-01-15 DIAGNOSIS — M7752 Other enthesopathy of left foot: Secondary | ICD-10-CM

## 2020-01-16 ENCOUNTER — Encounter: Payer: Self-pay | Admitting: Podiatry

## 2020-01-16 NOTE — Progress Notes (Signed)
  Subjective:  Patient ID: Sharon Branch, female    DOB: 03-Apr-1960,  MRN: [id number]  Chief Complaint  Patient presents with  . Foot Pain    Patient presents today for left forefoot pain under toes 2 and 3    59 y.o. female presents with the above complaint.  Patient presents with a complaint of left forefoot pain right under the third metatarsophalangeal joint.  She states that is very painful to touch.  She states she is feels like walking on a pebble.  She states the toes are starting crossover as well.  Is constant pain aches by the end of the day.  Patient has tried orthotics cortisone injection and various other conservative treatment options none of that has helped.  She was being treated at [facility] clinic.  She would like to take discuss her options as she is not getting any better.  She denies any other acute complaints.   Review of Systems: Negative except as noted in the HPI. Denies N/V/F/Ch.  Past Medical History:  Diagnosis Date  . GERD (gastroesophageal reflux disease)   . Hemorrhoid   . IBS (irritable bowel syndrome)   . Rosacea     Current Outpatient Medications:  .  doxycycline (PERIOSTAT) 20 MG tablet, Take by mouth., Disp: , Rfl:  .  Ivermectin (SOOLANTRA) 1 % CREA, APPLY ON FACE EVERY DAY, Disp: , Rfl:  .  escitalopram (LEXAPRO) 10 MG tablet, Take 1 tablet (10 mg total) by mouth daily., Disp: 90 tablet, Rfl: 3  Social History   Tobacco Use  Smoking Status Never Smoker  Smokeless Tobacco Never Used    Allergies  Allergen Reactions  . Codeine Nausea And Vomiting   Objective:  There were no vitals filed for this visit. There is no height or weight on file to calculate BMI. Constitutional Well developed. Well nourished.  Vascular Dorsalis pedis pulses palpable bilaterally. Posterior tibial pulses palpable bilaterally. Capillary refill normal to all digits.  No cyanosis or clubbing noted. Pedal hair growth normal.  Neurologic Normal speech. Oriented to  person, place, and time. Epicritic sensation to light touch grossly present bilaterally.  Dermatologic Nails well groomed and normal in appearance. No open wounds. No skin lesions.  Orthopedic:  Pain on palpation to the left third metatarsophalangeal joint.  Pain with mild range of motion of the third MPJ.  Negative Lachman stress test for plantar plate rupture.  Negative Mulder's click in the second interspace.  No component of neuromas noted   Radiographs: 3 views of skeletally mature the left foot: No osseous abnormality noted.  No concern for stress fracture noted.  No concern for avascular necrosis of the metatarsal head noted.  Medial deviation of the second digit on the metatarsophalangeal joint noted Assessment:   1. Capsulitis    Plan:  Patient was evaluated and treated and all questions answered.  Left third metatarsophalangeal joint capsulitis versus less likely neuroma -I explained to the patient the etiology of capsulitis and various treatment options were discussed given that patient has failed multiple steroid injection without any relief at this time patient will benefit from complete immobilization of the forefoot with a cam boot.  Patient agrees with the plan would like to proceed with immobilization.  If there is no improvement we will discuss getting an MRI to rule out neuroma versus capsulitis.  Patient agrees with the plan. -Cam boot was dispensed  No follow-ups on file.

## 2020-02-12 ENCOUNTER — Ambulatory Visit: Payer: BC Managed Care – PPO | Admitting: Podiatry

## 2020-02-12 ENCOUNTER — Other Ambulatory Visit: Payer: Self-pay

## 2020-02-12 ENCOUNTER — Encounter: Payer: Self-pay | Admitting: Podiatry

## 2020-02-12 DIAGNOSIS — M7752 Other enthesopathy of left foot: Secondary | ICD-10-CM

## 2020-02-12 NOTE — Progress Notes (Signed)
  Subjective:  Patient ID: Sharon Branch, female    DOB: 06-01-1960,  MRN: [id number]  Chief Complaint  Patient presents with  . capsulitis     1 month Follow-up. Feeling much better since last visit. No concerns voiced.     59 y.o. female presents with the above complaint.  Patient presents with complaint of left third metatarsophalangeal joint capsulitis.  Patient states her pain is completely resolved.  She has been doing really well with the cam boot immobilization.  She has also tried some self transition which helped a lot.  She denies any other acute complaints  Review of Systems: Negative except as noted in the HPI. Denies N/V/F/Ch.  Past Medical History:  Diagnosis Date  . GERD (gastroesophageal reflux disease)   . Hemorrhoid   . IBS (irritable bowel syndrome)   . Rosacea     Current Outpatient Medications:  .  doxycycline (PERIOSTAT) 20 MG tablet, Take by mouth., Disp: , Rfl:  .  escitalopram (LEXAPRO) 10 MG tablet, Take 1 tablet (10 mg total) by mouth daily., Disp: 90 tablet, Rfl: 3 .  Ivermectin (SOOLANTRA) 1 % CREA, APPLY ON FACE EVERY DAY, Disp: , Rfl:   Social History   Tobacco Use  Smoking Status Never Smoker  Smokeless Tobacco Never Used    Allergies  Allergen Reactions  . Codeine Nausea And Vomiting   Objective:  There were no vitals filed for this visit. There is no height or weight on file to calculate BMI. Constitutional Well developed. Well nourished.  Vascular Dorsalis pedis pulses palpable bilaterally. Posterior tibial pulses palpable bilaterally. Capillary refill normal to all digits.  No cyanosis or clubbing noted. Pedal hair growth normal.  Neurologic Normal speech. Oriented to person, place, and time. Epicritic sensation to light touch grossly present bilaterally.  Dermatologic Nails well groomed and normal in appearance. No open wounds. No skin lesions.  Orthopedic:  No pain on palpation to the left third metatarsophalangeal joint.  No  pain with mild range of motion of the third MPJ.  Negative Lachman stress test for plantar plate rupture.  Negative Mulder's click in the second interspace.  No component of neuromas noted   Radiographs: 3 views of skeletally mature the left foot: No osseous abnormality noted.  No concern for stress fracture noted.  No concern for avascular necrosis of the metatarsal head noted.  Medial deviation of the second digit on the metatarsophalangeal joint noted Assessment:   1. Capsulitis of metatarsophalangeal (MTP) joint of left foot    Plan:  Patient was evaluated and treated and all questions answered.  Left third metatarsophalangeal joint capsulitis versus less likely neuroma -Clinically healed with cam boot immobilization.  I discussed with her the importance of shoe gear modification as above.  She states understanding and will work on Mining engineer modification. -She already has orthotics and she will place them in the shoes.  I encouraged her to utilize orthotics at all times as this can help with the pressure distribution.  Patient states understanding -I discussed with that if any foot and ankle issues arise in the future come back and see me.  Patient states understanding   No follow-ups on file.

## 2020-05-27 ENCOUNTER — Telehealth: Payer: Self-pay

## 2020-05-27 ENCOUNTER — Ambulatory Visit: Payer: Self-pay | Admitting: Family Medicine

## 2020-05-27 NOTE — Progress Notes (Deleted)
    New patient visit   Patient: Sharon Branch   DOB: 28-Jun-1960   60 y.o. Female  MRN: [id number] Visit Date: 05/27/2020  Today's healthcare provider: Megan Mans, MD   No chief complaint on file.  Subjective    Sharon Branch is a 60 y.o. female who presents today as a new patient to establish care.  HPI  ***  Past Medical History:  Diagnosis Date  . GERD (gastroesophageal reflux disease)   . Hemorrhoid   . IBS (irritable bowel syndrome)   . Rosacea    Past Surgical History:  Procedure Laterality Date  . CESAREAN SECTION  1993  . LAPAROSCOPIC HYSTERECTOMY  2002   Family Status  Relation Name Status  . Mother  Deceased  . Father  Deceased   No family history on file. Social History   Socioeconomic History  . Marital status: Legally Separated    Spouse name: Not on file  . Number of children: Not on file  . Years of education: Not on file  . Highest education level: Not on file  Occupational History  . Not on file  Tobacco Use  . Smoking status: Never Smoker  . Smokeless tobacco: Never Used  Substance and Sexual Activity  . Alcohol use: No  . Drug use: No  . Sexual activity: Not Currently  Other Topics Concern  . Not on file  Social History Narrative  . Not on file   Social Determinants of Health   Financial Resource Strain: Not on file  Food Insecurity: Not on file  Transportation Needs: Not on file  Physical Activity: Not on file  Stress: Not on file  Social Connections: Not on file   Outpatient Medications Prior to Visit  Medication Sig  . doxycycline (PERIOSTAT) 20 MG tablet Take by mouth.  . escitalopram (LEXAPRO) 10 MG tablet Take 1 tablet (10 mg total) by mouth daily.  . Ivermectin (SOOLANTRA) 1 % CREA APPLY ON FACE EVERY DAY   No facility-administered medications prior to visit.   Allergies  Allergen Reactions  . Codeine Nausea And Vomiting    Immunization History  Administered Date(s) Administered  . Influenza,inj,Quad  PF,6+ Mos 12/13/2017    Health Maintenance  Topic Date Due  . Hepatitis C Screening  Never done  . HIV Screening  Never done  . TETANUS/TDAP  Never done  . COLONOSCOPY (Pts 45-52yrs Insurance coverage will need to be confirmed)  Never done  . MAMMOGRAM  Never done  . INFLUENZA VACCINE  09/30/2019  . PAP SMEAR-Modifier  01/25/2021  . HPV VACCINES  Aged Out    Patient Care Team: Patient, No Pcp Per as PCP - General (General Practice) Arvil Chaco, Belia Heman, MD (Unknown Physician Specialty) Earline Mayotte, MD (General Surgery)  Review of Systems  All other systems reviewed and are negative.   {Labs  Heme  Chem  Endocrine  Serology  Results Review (optional):[id number]::" "}  Objective    There were no vitals taken for this visit. Physical Exam ***  Depression Screen PHQ 2/9 Scores 04/19/2018 01/25/2018  PHQ - 2 Score 1 3  PHQ- 9 Score 4 10   No results found for any visits on 05/27/20.  Assessment & Plan     ***  No follow-ups on file.     {provider attestation***:1}   Megan Mans, MD  [facility] [phone] (phone) [phone] (fax)  [facility]

## 2020-05-27 NOTE — Telephone Encounter (Signed)
Copied from CRM [id number]. Topic: General - Other >> May 27, 2020  1:41 PM Gwenlyn Fudge wrote: Reason for CRM: Pt called stating that she has a sore throat and possible head cold. She has a new patient appt with [name] and is requesting to have a call back to reschedule. Please advise.

## 2020-05-27 NOTE — Telephone Encounter (Signed)
Patient has to be established with a provider to treated. Patient has no appointments scheduled with [name].

## 2020-05-27 NOTE — Telephone Encounter (Signed)
Pt had a New Patient appt scheduled with [name] today. It was cancelled by Aida Raider. Pt called with a sore throat and fever stating that she was not sure if she should come in or not. I advised ot that I would leave a message for her and ask someone to call her back to have that rescheduled. She requested to have a call back to be rescheduled. I attempted to reschedule pt, but Dr. Wonda Olds New patient schedule is blocked from [facility]. Please advise.

## 2020-08-19 ENCOUNTER — Other Ambulatory Visit: Payer: Self-pay | Admitting: Obstetrics and Gynecology

## 2020-08-20 NOTE — Telephone Encounter (Signed)
Called and left voicemail for patient to call back to be scheduled. 

## 2020-09-09 ENCOUNTER — Encounter: Payer: Self-pay | Admitting: Family Medicine

## 2020-09-09 ENCOUNTER — Ambulatory Visit: Payer: BC Managed Care – PPO | Admitting: Family Medicine

## 2020-09-09 ENCOUNTER — Other Ambulatory Visit: Payer: Self-pay

## 2020-09-09 VITALS — BP 107/75 | HR 75 | Temp 99.3°F | Resp 16 | Ht 61.0 in | Wt 131.0 lb

## 2020-09-09 DIAGNOSIS — L719 Rosacea, unspecified: Secondary | ICD-10-CM

## 2020-09-09 DIAGNOSIS — F4323 Adjustment disorder with mixed anxiety and depressed mood: Secondary | ICD-10-CM | POA: Diagnosis not present

## 2020-09-09 DIAGNOSIS — K2970 Gastritis, unspecified, without bleeding: Secondary | ICD-10-CM

## 2020-09-09 MED ORDER — OMEPRAZOLE 20 MG PO CPDR: 20.0000 mg | DELAYED_RELEASE_CAPSULE | Freq: Every day | ORAL | 3 refills | Status: DC

## 2020-09-09 NOTE — Progress Notes (Signed)
New patient visit   Patient: Sharon Branch   DOB: 13-Sep-1960   59 y.o. Female  MRN: [id number] Visit Date: 09/09/2020  Today's healthcare provider: Megan Mans, MD   Chief Complaint  Patient presents with   New Patient (Initial Visit)   Gastroesophageal Reflux   Subjective    Sharon Branch is a 60 y.o. female who presents today as a new patient to establish care.   GERD She is having side effects.  She IS experiencing abdominal bloating, belching, chest pain, heartburn, nausea, shortness of breath, and upper abdominal discomfort. She reports that she takes Tums with minimal relief. She has had an endoscopy before in the past, and reports that it was normal. She reports that her symptoms are becoming more frequent.  She is divorced woman who has a 57 year old daughter pursuing a masters degree in art, estate and a 58 year old son who works in Consulting civil engineer.  Past Medical History:  Diagnosis Date   GERD (gastroesophageal reflux disease)    Hemorrhoid    IBS (irritable bowel syndrome)    Rosacea    Past Surgical History:  Procedure Laterality Date   CESAREAN SECTION  1993   LAPAROSCOPIC HYSTERECTOMY  2002   Family Status  Relation Name Status   Mother  Deceased   Father  Deceased   No family history on file. Social History   Socioeconomic History   Marital status: Legally Separated    Spouse name: Not on file   Number of children: Not on file   Years of education: Not on file   Highest education level: Not on file  Occupational History   Not on file  Tobacco Use   Smoking status: Never   Smokeless tobacco: Never  Substance and Sexual Activity   Alcohol use: No   Drug use: No   Sexual activity: Not Currently  Other Topics Concern   Not on file  Social History Narrative   Not on file   Social Determinants of Health   Financial Resource Strain: Not on file  Food Insecurity: Not on file  Transportation Needs: Not on file  Physical Activity: Not on file   Stress: Not on file  Social Connections: Not on file   Outpatient Medications Prior to Visit  Medication Sig   doxycycline (PERIOSTAT) 20 MG tablet Take by mouth.   escitalopram (LEXAPRO) 10 MG tablet TAKE 1 TABLET BY MOUTH EVERY DAY   Ivermectin 1 % CREA APPLY ON FACE EVERY DAY (Patient not taking: No sig reported)   No facility-administered medications prior to visit.   Allergies  Allergen Reactions   Codeine Nausea And Vomiting    Immunization History  Administered Date(s) Administered   Influenza,inj,Quad PF,6+ Mos 12/13/2017    Health Maintenance  Topic Date Due   HIV Screening  Never done   Hepatitis C Screening  Never done   TETANUS/TDAP  Never done   COLONOSCOPY (Pts 45-52yrs Insurance coverage will need to be confirmed)  Never done   MAMMOGRAM  Never done   Zoster Vaccines- Shingrix (1 of 2) Never done   INFLUENZA VACCINE  09/29/2020   PAP SMEAR-Modifier  01/25/2021   Pneumococcal Vaccine 67-48 Years old  Aged Out   HPV VACCINES  Aged Out    Patient Care Team: Patient, No Pcp Per (Inactive) as PCP - General (General Practice) Arvil Chaco, Belia Heman, MD (Unknown Physician Specialty) Earline Mayotte, MD (General Surgery)  Review of Systems  Constitutional:  Negative for activity  change and fatigue.  Respiratory:  Positive for chest tightness and shortness of breath. Negative for cough.   Cardiovascular:  Negative for chest pain, palpitations and leg swelling.  Gastrointestinal:  Positive for abdominal pain and nausea. Negative for constipation, diarrhea, rectal pain and vomiting.  Genitourinary:  Negative for dysuria and frequency.  Musculoskeletal:  Negative for arthralgias, joint swelling and myalgias.  Neurological:  Negative for numbness and headaches.  Psychiatric/Behavioral:  Negative for agitation, self-injury, sleep disturbance and suicidal ideas. The patient is not nervous/anxious.       Objective    BP 107/75   Pulse 75   Temp 99.3 F (37.4 C)    Resp 16   Ht 5\' 1"  (1.549 m)   Wt 131 lb (59.4 kg)   BMI 24.75 kg/m  Physical Exam Vitals reviewed.  Constitutional:      Appearance: Normal appearance.  HENT:     Head: Normocephalic and atraumatic.     Right Ear: External ear normal.     Left Ear: External ear normal.     Nose: Nose normal.  Eyes:     General: No scleral icterus.    Conjunctiva/sclera: Conjunctivae normal.  Cardiovascular:     Rate and Rhythm: Normal rate and regular rhythm.     Pulses: Normal pulses.     Heart sounds: Normal heart sounds.  Pulmonary:     Effort: Pulmonary effort is normal.     Breath sounds: Normal breath sounds.  Abdominal:     Palpations: Abdomen is soft.     Tenderness: There is no abdominal tenderness.  Skin:    General: Skin is warm and dry.  Neurological:     General: No focal deficit present.     Mental Status: She is alert and oriented to person, place, and time.  Psychiatric:        Mood and Affect: Mood normal.        Behavior: Behavior normal.        Thought Content: Thought content normal.        Judgment: Judgment normal.      Depression Screen PHQ 2/9 Scores 09/09/2020 04/19/2018 01/25/2018  PHQ - 2 Score 0 1 3  PHQ- 9 Score - 4 10   No results found for any visits on 09/09/20.  Assessment & Plan      1. Gastritis without bleeding, unspecified chronicity, unspecified gastritis type After discussion with patient we will try omeprazole 20 mg every morning but she is aware that I think the her irritation might be due to her doxycycline for her rosacea although at a low dose.  The timing is such that it started with the onset of taking doxycycline. - omeprazole (PRILOSEC) 20 MG capsule; Take 1 capsule (20 mg total) by mouth daily.  Dispense: 30 capsule; Refill: 3 - CBC with Differential/Platelet - Comprehensive metabolic panel  2. Rosacea On Doxy.  Continued MetroGel or alternative treatment for the stopping for a while.  3. Adjustment disorder with mixed  anxiety and depressed mood On Lexapro.  We will discuss on her next visit.  No follow-ups on file.     I, 11/10/20, MD, have reviewed all documentation for this visit. The documentation on 09/13/20 for the exam, diagnosis, procedures, and orders are all accurate and complete.    Sharon Branch Vanlanen 09/15/20, MD  [facility] [phone] (phone) [phone] (fax)  [facility]

## 2020-09-10 LAB — CBC WITH DIFFERENTIAL/PLATELET
Basophils Absolute: 0 10*3/uL (ref 0.0–0.2)
Basos: 1 %
EOS (ABSOLUTE): 0.1 10*3/uL (ref 0.0–0.4)
Eos: 2 %
Hematocrit: 39.9 % (ref 34.0–46.6)
Hemoglobin: 12.8 g/dL (ref 11.1–15.9)
Immature Grans (Abs): 0 10*3/uL (ref 0.0–0.1)
Immature Granulocytes: 0 %
Lymphocytes Absolute: 1.4 10*3/uL (ref 0.7–3.1)
Lymphs: 25 %
MCH: 26.7 pg (ref 26.6–33.0)
MCHC: 32.1 g/dL (ref 31.5–35.7)
MCV: 83 fL (ref 79–97)
Monocytes Absolute: 0.4 10*3/uL (ref 0.1–0.9)
Monocytes: 7 %
Neutrophils Absolute: 3.8 10*3/uL (ref 1.4–7.0)
Neutrophils: 65 %
Platelets: 291 10*3/uL (ref 150–450)
RBC: 4.79 x10E6/uL (ref 3.77–5.28)
RDW: 14 % (ref 11.7–15.4)
WBC: 5.8 10*3/uL (ref 3.4–10.8)

## 2020-09-10 LAB — COMPREHENSIVE METABOLIC PANEL
ALT: 14 IU/L (ref 0–32)
AST: 21 IU/L (ref 0–40)
Albumin/Globulin Ratio: 2.3 — ABNORMAL HIGH (ref 1.2–2.2)
Albumin: 4.5 g/dL (ref 3.8–4.9)
Alkaline Phosphatase: 167 IU/L — ABNORMAL HIGH (ref 44–121)
BUN/Creatinine Ratio: 36 — ABNORMAL HIGH (ref 9–23)
BUN: 23 mg/dL (ref 6–24)
Bilirubin Total: 0.3 mg/dL (ref 0.0–1.2)
CO2: 23 mmol/L (ref 20–29)
Calcium: 9 mg/dL (ref 8.7–10.2)
Chloride: 99 mmol/L (ref 96–106)
Creatinine, Ser: 0.64 mg/dL (ref 0.57–1.00)
Globulin, Total: 2 g/dL (ref 1.5–4.5)
Glucose: 88 mg/dL (ref 65–99)
Potassium: 4.2 mmol/L (ref 3.5–5.2)
Sodium: 137 mmol/L (ref 134–144)
Total Protein: 6.5 g/dL (ref 6.0–8.5)
eGFR: 102 mL/min/{1.73_m2} (ref >59)

## 2020-09-17 ENCOUNTER — Encounter: Payer: Self-pay | Admitting: Obstetrics and Gynecology

## 2020-09-17 ENCOUNTER — Other Ambulatory Visit: Payer: Self-pay

## 2020-09-17 ENCOUNTER — Ambulatory Visit (INDEPENDENT_AMBULATORY_CARE_PROVIDER_SITE_OTHER): Payer: BC Managed Care – PPO | Admitting: Obstetrics and Gynecology

## 2020-09-17 VITALS — BP 116/72 | HR 62 | Ht 61.0 in | Wt 130.0 lb

## 2020-09-17 DIAGNOSIS — Z01419 Encounter for gynecological examination (general) (routine) without abnormal findings: Secondary | ICD-10-CM

## 2020-09-17 DIAGNOSIS — Z1239 Encounter for other screening for malignant neoplasm of breast: Secondary | ICD-10-CM | POA: Diagnosis not present

## 2020-09-17 DIAGNOSIS — Z1231 Encounter for screening mammogram for malignant neoplasm of breast: Secondary | ICD-10-CM | POA: Diagnosis not present

## 2020-09-17 MED ORDER — ESCITALOPRAM OXALATE 10 MG PO TABS
10.0000 mg | ORAL_TABLET | Freq: Every day | ORAL | 3 refills | Status: DC
Start: 2020-09-17 — End: 2021-11-24

## 2020-09-17 NOTE — Patient Instructions (Signed)
[facility] [address] [place] [address]  [facility] [place]  [address] [place] [address]  Phone: [phone]  

## 2020-09-17 NOTE — Progress Notes (Signed)
Gynecology Annual Exam  PCP: [name]., MD  Chief Complaint:  Chief Complaint  Patient presents with   Annual Exam    History of Present Illness:Patient is a 60 y.o. G2P2 presents for annual exam. The patient has no complaints today.   LMP: No LMP recorded. Patient has had a hysterectomy. No PMB  The patient is not sexually active. She The patient does perform self breast exams.  There is no notable family history of breast or ovarian cancer in her family.  The patient wears seatbelts: yes.   The patient has regular exercise: not asked.    The patient denies current symptoms of depression.  Doing well on current dose of Lexapro.  Review of Systems: Review of Systems  Constitutional:  Negative for chills and fever.  HENT:  Negative for congestion.   Respiratory:  Negative for cough and shortness of breath.   Cardiovascular:  Negative for chest pain and palpitations.  Gastrointestinal:  Negative for abdominal pain, constipation, diarrhea, heartburn, nausea and vomiting.  Genitourinary:  Negative for dysuria, frequency and urgency.  Skin:  Negative for itching and rash.  Neurological:  Negative for dizziness and headaches.  Endo/Heme/Allergies:  Negative for polydipsia.  Psychiatric/Behavioral:  Negative for depression.    Past Medical History:  Patient Active Problem List   Diagnosis Date Noted   Rosacea 01/14/2020   Encounter for screening colonoscopy 09/07/2013    Past Surgical History:  Past Surgical History:  Procedure Laterality Date   CESAREAN SECTION  1993   LAPAROSCOPIC HYSTERECTOMY  2002    Gynecologic History:  No LMP recorded. Patient has had a hysterectomy. Last Pap: Results were: 01/25/2018 NIL and HR HPV negative   Obstetric History: G2P2  Family History:  No family history on file.  Social History:  Social History   Socioeconomic History   Marital status: Legally Separated    Spouse name: Not on file   Number of children:  Not on file   Years of education: Not on file   Highest education level: Not on file  Occupational History   Not on file  Tobacco Use   Smoking status: Never   Smokeless tobacco: Never  Substance and Sexual Activity   Alcohol use: No   Drug use: No   Sexual activity: Not Currently  Other Topics Concern   Not on file  Social History Narrative   Not on file   Social Determinants of Health   Financial Resource Strain: Not on file  Food Insecurity: Not on file  Transportation Needs: Not on file  Physical Activity: Not on file  Stress: Not on file  Social Connections: Not on file  Intimate Partner Violence: Not on file    Allergies:  Allergies  Allergen Reactions   Codeine Nausea And Vomiting    Medications: Prior to Admission medications   Medication Sig Start Date End Date Taking? Authorizing Provider  doxycycline (PERIOSTAT) 20 MG tablet Take by mouth. 11/09/18  Yes [provider]  escitalopram (LEXAPRO) 10 MG tablet TAKE 1 TABLET BY MOUTH EVERY DAY 08/20/20  Yes Vena Austria, MD  omeprazole (PRILOSEC) 20 MG capsule Take 1 capsule (20 mg total) by mouth daily. 09/09/20  Yes [name]., MD  Ivermectin 1 % CREA APPLY ON FACE EVERY DAY Patient not taking: No sig reported 02/17/17   [provider]    Physical Exam Vitals: Blood pressure 116/72, pulse 62, height 5\' 1"  (1.549 m), weight 130 lb (59  kg).  General: NAD HEENT: normocephalic, anicteric Thyroid: no enlargement, no palpable nodules Pulmonary: No increased work of breathing, CTAB Cardiovascular: RRR, distal pulses 2+ Breast: Breast symmetrical, no tenderness, no palpable nodules or masses, no skin or nipple retraction present, no nipple discharge.  No axillary or supraclavicular lymphadenopathy. Abdomen: NABS, soft, non-tender, non-distended.  Umbilicus without lesions.  No hepatomegaly, splenomegaly or masses palpable. No evidence of hernia  Genitourinary:  External: Normal  external female genitalia.  Normal urethral meatus, normal Bartholin's and Skene's glands.    Vagina: Normal vaginal mucosa, no evidence of prolapse.    Cervix: surgically absent  Uterus: surgically absent  Adnexa: ovaries non-enlarged, no adnexal masses  Rectal: deferred  Lymphatic: no evidence of inguinal lymphadenopathy Extremities: no edema, erythema, or tenderness Neurologic: Grossly intact Psychiatric: mood appropriate, affect full  Female chaperone present for pelvic and breast  portions of the physical exam     Assessment: 60 y.o. G2P2 routine annual exam  Plan: Problem List Items Addressed This Visit   None Visit Diagnoses     Encounter for gynecological examination without abnormal finding    -  Primary   Breast screening       Breast cancer screening by mammogram       Relevant Orders   MM 3D SCREEN BREAST BILATERAL       1) Mammogram - recommend yearly screening mammogram.  Mammogram Was ordered today  2) STI screening  was notoffered and therefore not obtained  3) ASCCP guidelines and rational discussed.  Patient opts for every 3 years screening interval  4) Osteoporosis  - per USPTF routine screening DEXA at age 72  5) Routine healthcare maintenance including cholesterol, diabetes screening discussed managed by PCP  6) Colonoscopy UTD  7) Return in about 1 year (around 09/17/2021) for annual.    Vena Austria, MD Domingo Pulse, [facility] 09/17/2020, 2:45 PM

## 2020-09-22 ENCOUNTER — Other Ambulatory Visit: Payer: Self-pay | Admitting: Obstetrics and Gynecology

## 2020-09-22 DIAGNOSIS — Z1231 Encounter for screening mammogram for malignant neoplasm of breast: Secondary | ICD-10-CM

## 2020-09-25 ENCOUNTER — Ambulatory Visit
Admission: RE | Admit: 2020-09-25 | Discharge: 2020-09-25 | Disposition: A | Discharge status: Home / Self Care | Payer: BC Managed Care – PPO | Source: Ambulatory Visit

## 2020-09-25 ENCOUNTER — Other Ambulatory Visit: Payer: Self-pay

## 2020-09-25 DIAGNOSIS — Z1231 Encounter for screening mammogram for malignant neoplasm of breast: Secondary | ICD-10-CM

## 2020-10-12 ENCOUNTER — Encounter: Payer: Self-pay | Admitting: Family Medicine

## 2020-11-12 ENCOUNTER — Ambulatory Visit: Payer: BC Managed Care – PPO | Admitting: Family Medicine

## 2020-11-12 ENCOUNTER — Other Ambulatory Visit: Payer: Self-pay

## 2020-11-12 ENCOUNTER — Encounter: Payer: Self-pay | Admitting: Family Medicine

## 2020-11-12 VITALS — BP 94/59 | HR 62 | Resp 16 | Ht 61.0 in | Wt 133.0 lb

## 2020-11-12 DIAGNOSIS — K219 Gastro-esophageal reflux disease without esophagitis: Secondary | ICD-10-CM

## 2020-11-12 DIAGNOSIS — Z23 Encounter for immunization: Secondary | ICD-10-CM | POA: Diagnosis not present

## 2020-11-12 MED ORDER — FAMOTIDINE 20 MG PO TABS: 20.0000 mg | ORAL_TABLET | Freq: Two times a day (BID) | ORAL | 11 refills | Status: DC

## 2020-11-12 NOTE — Progress Notes (Signed)
I,April Miller,acting as a scribe for Megan Mans, MD.,have documented all relevant documentation on the behalf of Megan Mans, MD,as directed by  Megan Mans, MD while in the presence of Megan Mans, MD.   Established patient visit   Patient: Sharon Branch   DOB: [birth year]   60 y.o. Female  MRN: [id number] Visit Date: 11/12/2020  Today's healthcare provider: Megan Mans, MD   Chief Complaint  Patient presents with   Follow-up   Gastroesophageal Reflux   Subjective    HPI  Still having reflux symptoms off of medication. GERD, Follow up:  The patient was last seen for GERD 2 months ago. Changes made since that visit include starting on Omeprazole 20mg  daily. After about three weeks of taking it, she started having headaches and joint soreness.  The body soreness is still an issue.  It was helping her acid reflux but it made her feel like I have the flu.  She reports fair compliance with treatment.  Patient states after stopping omeprazole, headaches and joint soreness have almost went away. Patient states she is taking TUMS when she has reflux.     Medications: Outpatient Medications Prior to Visit  Medication Sig   doxycycline (PERIOSTAT) 20 MG tablet Take by mouth.   escitalopram (LEXAPRO) 10 MG tablet Take 1 tablet (10 mg total) by mouth daily.   Ivermectin 1 % CREA APPLY ON FACE EVERY DAY (Patient not taking: No sig reported)   omeprazole (PRILOSEC) 20 MG capsule Take 1 capsule (20 mg total) by mouth daily. (Patient not taking: Reported on 11/12/2020)   No facility-administered medications prior to visit.    Review of Systems  Constitutional:  Negative for activity change and fatigue.  Respiratory:  Negative for cough and shortness of breath.   Cardiovascular:  Negative for chest pain, palpitations and leg swelling.  Musculoskeletal:  Positive for arthralgias and myalgias.  Neurological:  Negative for dizziness,  light-headedness and headaches.  Psychiatric/Behavioral:  Negative for self-injury, sleep disturbance and suicidal ideas. The patient is not nervous/anxious.        Objective    BP (!) 94/59 (BP Location: Right Arm, Patient Position: Sitting, Cuff Size: Normal)   Pulse 62   Resp 16   Ht 5\' 1"  (1.549 m)   Wt 133 lb (60.3 kg)   SpO2 99%   BMI 25.13 kg/m  Wt Readings from Last 3 Encounters:  11/12/20 133 lb (60.3 kg)  09/17/20 130 lb (59 kg)  09/09/20 131 lb (59.4 kg)      Physical Exam Vitals reviewed.  Constitutional:      Appearance: Normal appearance.  HENT:     Head: Normocephalic and atraumatic.     Right Ear: External ear normal.     Left Ear: External ear normal.     Nose: Nose normal.  Eyes:     General: No scleral icterus.    Conjunctiva/sclera: Conjunctivae normal.  Cardiovascular:     Rate and Rhythm: Normal rate and regular rhythm.     Pulses: Normal pulses.     Heart sounds: Normal heart sounds.  Pulmonary:     Effort: Pulmonary effort is normal.     Breath sounds: Normal breath sounds.  Abdominal:     Palpations: Abdomen is soft.     Tenderness: There is no abdominal tenderness.  Skin:    General: Skin is warm and dry.  Neurological:     General: No focal deficit present.  Mental Status: She is alert and oriented to person, place, and time.  Psychiatric:        Mood and Affect: Mood normal.        Behavior: Behavior normal.        Thought Content: Thought content normal.        Judgment: Judgment normal.      No results found for any visits on 11/12/20.  Assessment & Plan     1. Gastroesophageal reflux disease, unspecified whether esophagitis present Pepcid twice a day for a month and then as needed.Side effects with PPI. - famotidine (PEPCID) 20 MG tablet; Take 1 tablet (20 mg total) by mouth 2 (two) times daily.  Dispense: 60 tablet; Refill: 11  2. Need for influenza vaccination  - Flu Vaccine QUAD 3mo+IM (Fluarix, Fluzone & Alfiuria  Quad PF)   No follow-ups on file.      I, Megan Mans, MD, have reviewed all documentation for this visit. The documentation on 11/16/20 for the exam, diagnosis, procedures, and orders are all accurate and complete.    Armoni Kludt Wendelyn Breslow, MD  [facility] [phone] (phone) [phone] (fax)  [facility]

## 2021-03-17 ENCOUNTER — Ambulatory Visit: Payer: BC Managed Care – PPO | Admitting: Family Medicine

## 2021-05-06 ENCOUNTER — Encounter: Payer: Self-pay | Admitting: Family Medicine

## 2021-05-06 ENCOUNTER — Other Ambulatory Visit: Payer: Self-pay

## 2021-05-06 ENCOUNTER — Ambulatory Visit: Payer: BC Managed Care – PPO | Admitting: Family Medicine

## 2021-05-06 VITALS — BP 115/78 | HR 59 | Temp 98.0°F | Ht 61.0 in | Wt 130.0 lb

## 2021-05-06 DIAGNOSIS — K2970 Gastritis, unspecified, without bleeding: Secondary | ICD-10-CM

## 2021-05-06 DIAGNOSIS — K219 Gastro-esophageal reflux disease without esophagitis: Secondary | ICD-10-CM

## 2021-05-06 MED ORDER — PANTOPRAZOLE SODIUM 40 MG PO TBEC: 40.0000 mg | DELAYED_RELEASE_TABLET | ORAL | 3 refills | Status: DC

## 2021-05-06 NOTE — Progress Notes (Signed)
?  ? ? ?Established patient visit ? ?I,April Miller,acting as a scribe for Megan Mans, MD.,have documented all relevant documentation on the behalf of Megan Mans, MD,as directed by  Megan Mans, MD while in the presence of Megan Mans, MD. ? ? ? ?Patient: Sharon Branch   DOB: 05/13/1960   61 y.o. Female  MRN: [id number] ?Visit Date: 05/06/2021 ? ?Today's healthcare provider: Megan Mans, MD  ? ?Chief Complaint  ?Patient presents with  ? Follow-up  ? Gastroesophageal Reflux  ? ?Subjective  ?  ?HPI  ?Patient is no better on Pepcid.  She states she did not like taking omeprazole but was not sure what type of side effects she had with it.  She continues to have epigastric distress and reflux heartburn symptoms.  No chest pain.  No shortness of breath.  No weight loss.  No dysphagia. ?GERD, Follow up: ? ?The patient was last seen for GERD 6 months ago. ?Changes made since that visit include; Pepcid twice a day for a month and then as needed. Side effects with PPI. ? ?She reports good compliance with treatment. ?She is not having side effects. none. ? ?She IS experiencing upper abdominal discomfort. ? ?Patient completed 90 days of Pepcid. Patient is still having upper gi pain. ?----------------------------------------------------------------------------------------- ? ? ?Medications: ?Outpatient Medications Prior to Visit  ?Medication Sig  ? escitalopram (LEXAPRO) 10 MG tablet Take 1 tablet (10 mg total) by mouth daily.  ? famotidine (PEPCID) 20 MG tablet Take 1 tablet (20 mg total) by mouth 2 (two) times daily. (Patient not taking: Reported on 05/06/2021)  ? [DISCONTINUED] doxycycline (PERIOSTAT) 20 MG tablet Take by mouth. (Patient not taking: Reported on 05/06/2021)  ? [DISCONTINUED] Ivermectin 1 % CREA APPLY ON FACE EVERY DAY (Patient not taking: Reported on 09/09/2020)  ? [DISCONTINUED] omeprazole (PRILOSEC) 20 MG capsule Take 1 capsule (20 mg total) by mouth daily. (Patient not taking:  Reported on 11/12/2020)  ? ?No facility-administered medications prior to visit.  ? ? ?Review of Systems  ?Constitutional:  Negative for appetite change, chills, fatigue and fever.  ?Respiratory:  Negative for chest tightness and shortness of breath.   ?Cardiovascular:  Negative for chest pain and palpitations.  ?Gastrointestinal:  Negative for abdominal pain, nausea and vomiting.  ?Neurological:  Negative for dizziness and weakness.  ? ?  ?  Objective  ?  ?BP 115/78 (BP Location: Right Arm, Patient Position: Sitting, Cuff Size: Normal)   Pulse (!) 59   Temp 98 ?F (36.7 ?C) (Temporal)   Ht 5\' 1"  (1.549 m)   Wt 130 lb (59 kg)   SpO2 (!) 16%   PF 98 L/min   BMI 24.56 kg/m?  ?Wt Readings from Last 3 Encounters:  ?05/06/21 130 lb (59 kg)  ?11/12/20 133 lb (60.3 kg)  ?09/17/20 130 lb (59 kg)  ? ?  ? ?Physical Exam ?Vitals reviewed.  ?Constitutional:   ?   Appearance: Normal appearance.  ?HENT:  ?   Head: Normocephalic and atraumatic.  ?   Right Ear: External ear normal.  ?   Left Ear: External ear normal.  ?   Nose: Nose normal.  ?Eyes:  ?   General: No scleral icterus. ?   Conjunctiva/sclera: Conjunctivae normal.  ?Cardiovascular:  ?   Rate and Rhythm: Normal rate and regular rhythm.  ?   Pulses: Normal pulses.  ?   Heart sounds: Normal heart sounds.  ?Pulmonary:  ?   Effort: Pulmonary effort is normal.  ?  Breath sounds: Normal breath sounds.  ?Abdominal:  ?   Palpations: Abdomen is soft.  ?   Tenderness: There is no abdominal tenderness.  ?Skin: ?   General: Skin is warm and dry.  ?Neurological:  ?   General: No focal deficit present.  ?   Mental Status: She is alert and oriented to person, place, and time.  ?Psychiatric:     ?   Mood and Affect: Mood normal.     ?   Behavior: Behavior normal.     ?   Thought Content: Thought content normal.     ?   Judgment: Judgment normal.  ?  ? ? ?No results found for any visits on 05/06/21. ? Assessment & Plan  ?  ? ?1. Gastroesophageal reflux disease, unspecified whether  esophagitis present ?Stop Pepcid and try pantoprazole.  Refer to GI for evaluation.  Labs were normal last years when she was having the same symptoms. ?- pantoprazole (PROTONIX) 40 MG tablet; Take 1 tablet (40 mg total) by mouth every morning.  Dispense: 30 tablet; Refill: 3 ? ?2. Gastritis without bleeding, unspecified chronicity, unspecified gastritis type ?Consider adding sucralfate in the near future if she has to wait for any referral ?- Ambulatory referral to Gastroenterology ? ? ?No follow-ups on file.  ?   ? ?I, Megan Mans, MD, have reviewed all documentation for this visit. The documentation on 05/10/21 for the exam, diagnosis, procedures, and orders are all accurate and complete. ? ? ? ?Waunetta Riggle Wendelyn Breslow, MD  ?[facility] ?[phone] (phone) ?[phone] (fax) ? ?[facility] ?

## 2021-05-07 ENCOUNTER — Telehealth: Payer: Self-pay

## 2021-05-07 NOTE — Telephone Encounter (Signed)
Scheduled for 08/05/2021 ?

## 2021-05-07 NOTE — Telephone Encounter (Signed)
CALLED PATIENT NO ANSWER LEFT VOICEMAIL FOR A CALL BACK ? ?

## 2021-05-19 ENCOUNTER — Encounter: Payer: Self-pay | Admitting: Family Medicine

## 2021-08-05 ENCOUNTER — Ambulatory Visit: Payer: BC Managed Care – PPO | Admitting: Gastroenterology

## 2021-08-05 ENCOUNTER — Other Ambulatory Visit: Payer: Self-pay

## 2021-08-05 ENCOUNTER — Encounter: Payer: Self-pay | Admitting: Gastroenterology

## 2021-08-05 VITALS — BP 122/61 | HR 60 | Temp 98.7°F | Ht 61.0 in | Wt 130.5 lb

## 2021-08-05 DIAGNOSIS — G8929 Other chronic pain: Secondary | ICD-10-CM | POA: Diagnosis not present

## 2021-08-05 DIAGNOSIS — R1013 Epigastric pain: Secondary | ICD-10-CM | POA: Diagnosis not present

## 2021-08-05 DIAGNOSIS — R748 Abnormal levels of other serum enzymes: Secondary | ICD-10-CM | POA: Diagnosis not present

## 2021-08-05 NOTE — Progress Notes (Signed)
[name], MD [address]  [address]  [place], [place] [address]  Main: [phone]  Fax: [phone]    Gastroenterology Consultation  Referring Provider:     [name].,* Primary Care Physician:  [name]., MD Primary Gastroenterologist:  Dr. [name] Reason for Consultation: Epigastric pain        HPI:   Sharon Branch is a 61 y.o. female referred by [name], Leonette Monarch., MD  for consultation & management of epigastric pain.  Patient reports that she has been experiencing intermittent episodes of epigastric pain, sensation of food stuck in substernal area, this occurs 1 out of 3 meals a day several times a week.  This has been ongoing for several years.  She underwent ultrasound abdomen in 2016 which was normal.  H. pylori IgG was negative.  She is recently started on Protonix 40 mg p.o. daily and does not seem to help.  Patient is very active, weight has been stable.  She denies any nausea or vomiting.  She does report episodes of regurgitation.  She denies any heartburn.  She denies any radiation of pain.  Her labs are unremarkable except for mildly elevated alkaline phosphatase levels.  Alkaline phosphatase isoenzymes were normal in the past.  Patient does have history of osteoporosis  Patient does not smoke or drink alcohol . NSAIDs: None  Antiplts/Anticoagulants/Anti thrombotics: None  GI Procedures: Reports undergoing colonoscopy within 10 years, reportedly normal Denies any family history of GI malignancy  Past Medical History:  Diagnosis Date   GERD (gastroesophageal reflux disease)    Hemorrhoid    IBS (irritable bowel syndrome)    Rosacea     Past Surgical History:  Procedure Laterality Date   CESAREAN SECTION  1993   LAPAROSCOPIC HYSTERECTOMY  2002    Current Outpatient Medications:    escitalopram (LEXAPRO) 10 MG tablet, Take 1 tablet (10 mg total) by mouth daily., Disp: 90 tablet, Rfl: 3   pantoprazole  (PROTONIX) 40 MG tablet, Take 1 tablet (40 mg total) by mouth every morning., Disp: 30 tablet, Rfl: 3   famotidine (PEPCID) 20 MG tablet, Take 1 tablet (20 mg total) by mouth 2 (two) times daily. (Patient not taking: Reported on 05/06/2021), Disp: 60 tablet, Rfl: 11   Family History  Problem Relation Age of Onset   Breast cancer Mother      Social History   Tobacco Use   Smoking status: Never   Smokeless tobacco: Never  Substance Use Topics   Alcohol use: No   Drug use: No    Allergies as of 08/05/2021 - Review Complete 08/05/2021  Allergen Reaction Noted   Codeine Nausea And Vomiting 09/05/2013    Review of Systems:    All systems reviewed and negative except where noted in HPI.   Physical Exam:  BP 122/61 (BP Location: Left Arm, Patient Position: Sitting, Cuff Size: Normal)   Pulse 60   Temp 98.7 F (37.1 C) (Oral)   Ht 5\' 1"  (1.549 m)   Wt 130 lb 8 oz (59.2 kg)   BMI 24.66 kg/m  No LMP recorded. Patient has had a hysterectomy.  General:   Alert,  Well-developed, well-nourished, pleasant and cooperative in NAD Head:  Normocephalic and atraumatic. Eyes:  Sclera clear, no icterus.   Conjunctiva pink. Ears:  Normal auditory acuity. Nose:  No deformity, discharge, or lesions. Mouth:  No deformity or lesions,oropharynx pink & moist. Neck:  Supple; no masses  or thyromegaly. Lungs:  Respirations even and unlabored.  Clear throughout to auscultation.   No wheezes, crackles, or rhonchi. No acute distress. Heart:  Regular rate and rhythm; no murmurs, clicks, rubs, or gallops. Abdomen:  Normal bowel sounds. Soft, non-tender and non-distended without masses, hepatosplenomegaly or hernias noted.  No guarding or rebound tenderness.   Rectal: Not performed Msk:  Symmetrical without gross deformities. Good, equal movement & strength bilaterally. Pulses:  Normal pulses noted. Extremities:  No clubbing or edema.  No cyanosis. Neurologic:  Alert and oriented x3;  grossly normal  neurologically. Skin:  Intact without significant lesions or rashes. No jaundice. Psych:  Alert and cooperative. Normal mood and affect.  Imaging Studies: Reviewed  Assessment and Plan:   Sharon Branch is a 61 y.o. pleasant Caucasian female with history of osteoporosis is seen in consultation for chronic intermittent episodes of epigastric discomfort, sensation of food stuck in the epigastric area.  Patient denies any other associated symptoms.  No evidence of anemia.  H. pylori antibody was negative in the past.  Normal TTG IgA and total IgA in the past  Recommend EGD for further evaluation If EGD is unremarkable, recommend CT abdomen and pelvis  Isolated elevated alkaline phosphatase levels Alkaline phosphatase isoenzymes are normal Ultrasound liver normal Patient has osteoporosis Can check serum GGT levels   Follow up as needed based on the above work-up   [name], MD

## 2021-08-09 ENCOUNTER — Other Ambulatory Visit: Payer: Self-pay | Admitting: Family Medicine

## 2021-08-09 DIAGNOSIS — K219 Gastro-esophageal reflux disease without esophagitis: Secondary | ICD-10-CM

## 2021-08-10 ENCOUNTER — Encounter: Payer: Self-pay | Admitting: Gastroenterology

## 2021-08-10 ENCOUNTER — Ambulatory Visit: Payer: BC Managed Care – PPO | Admitting: Anesthesiology

## 2021-08-10 ENCOUNTER — Ambulatory Visit
Admission: RE | Admit: 2021-08-10 | Discharge: 2021-08-10 | Disposition: A | Discharge status: Home / Self Care | Payer: BC Managed Care – PPO | Attending: Gastroenterology | Admitting: Gastroenterology

## 2021-08-10 ENCOUNTER — Encounter
Admission: RE | Disposition: A | Discharge status: Home / Self Care | Payer: Self-pay | Source: Home / Self Care | Attending: Gastroenterology

## 2021-08-10 DIAGNOSIS — G8929 Other chronic pain: Secondary | ICD-10-CM | POA: Diagnosis not present

## 2021-08-10 DIAGNOSIS — Z90710 Acquired absence of both cervix and uterus: Secondary | ICD-10-CM | POA: Diagnosis not present

## 2021-08-10 DIAGNOSIS — K589 Irritable bowel syndrome without diarrhea: Secondary | ICD-10-CM | POA: Diagnosis not present

## 2021-08-10 DIAGNOSIS — K449 Diaphragmatic hernia without obstruction or gangrene: Secondary | ICD-10-CM

## 2021-08-10 DIAGNOSIS — R1013 Epigastric pain: Secondary | ICD-10-CM

## 2021-08-10 DIAGNOSIS — K219 Gastro-esophageal reflux disease without esophagitis: Secondary | ICD-10-CM | POA: Diagnosis not present

## 2021-08-10 DIAGNOSIS — R1314 Dysphagia, pharyngoesophageal phase: Secondary | ICD-10-CM | POA: Diagnosis not present

## 2021-08-10 DIAGNOSIS — Z8619 Personal history of other infectious and parasitic diseases: Secondary | ICD-10-CM | POA: Insufficient documentation

## 2021-08-10 HISTORY — PX: ESOPHAGOGASTRODUODENOSCOPY (EGD) WITH PROPOFOL: SHX5813

## 2021-08-10 SURGERY — ESOPHAGOGASTRODUODENOSCOPY (EGD) WITH PROPOFOL
Anesthesia: General

## 2021-08-10 MED ORDER — PROPOFOL 500 MG/50ML IV EMUL
INTRAVENOUS | Status: DC | PRN
  Administered 2021-08-10: 150 ug/kg/min via INTRAVENOUS

## 2021-08-10 MED ORDER — SODIUM CHLORIDE 0.9 % IV SOLN: INTRAVENOUS | Status: DC | PRN

## 2021-08-10 MED ORDER — FENTANYL CITRATE (PF) 100 MCG/2ML IJ SOLN
INTRAMUSCULAR | Status: DC | PRN
  Administered 2021-08-10: 50 ug via INTRAVENOUS

## 2021-08-10 MED ORDER — SODIUM CHLORIDE 0.9 % IV SOLN: INTRAVENOUS | Status: DC

## 2021-08-10 MED ORDER — PROPOFOL 10 MG/ML IV BOLUS
INTRAVENOUS | Status: DC | PRN
  Administered 2021-08-10: 50 mg via INTRAVENOUS

## 2021-08-10 MED ORDER — MIDAZOLAM HCL 2 MG/2ML IJ SOLN
INTRAMUSCULAR | Status: DC | PRN
  Administered 2021-08-10: 2 mg via INTRAVENOUS

## 2021-08-10 MED ORDER — MIDAZOLAM HCL 2 MG/2ML IJ SOLN
INTRAMUSCULAR | Status: AC
  Filled 2021-08-10: qty 2

## 2021-08-10 MED ORDER — FENTANYL CITRATE (PF) 100 MCG/2ML IJ SOLN
INTRAMUSCULAR | Status: AC
  Filled 2021-08-10: qty 2

## 2021-08-10 MED ORDER — LIDOCAINE HCL (CARDIAC) PF 100 MG/5ML IV SOSY
PREFILLED_SYRINGE | INTRAVENOUS | Status: DC | PRN
  Administered 2021-08-10: 50 mg via INTRAVENOUS

## 2021-08-10 NOTE — Op Note (Signed)
[facility] Gastroenterology Patient Name: Sharon Branch Procedure Date: 08/10/2021 10:28 AM MRN: [id number] Account #: [id number] Date of Birth: 09/07/1960 Admit Type: Outpatient Age: 61 [facility] 4 Gender: Female Note Status: Finalized Instrument Name: Upper Endoscope [id number] Procedure:             Upper GI endoscopy Indications:           Esophageal dysphagia Providers:             Toney Reilohini Reddy Jerriah Ines MD, MD Referring MD:          Ferdinand Langoichard L. Sullivan LoneGilbert, MD (Referring MD) Medicines:             General Anesthesia Complications:         No immediate complications. Estimated blood loss: None. Procedure:             Pre-Anesthesia Assessment:                        - Prior to the procedure, a History and Physical was                         performed, and patient medications and allergies were                         reviewed. The patient is competent. The risks and                         benefits of the procedure and the sedation options and                         risks were discussed with the patient. All questions                         were answered and informed consent was obtained.                         Patient identification and proposed procedure were                         verified by the physician, the nurse, the                         anesthesiologist, the anesthetist and the technician                         in the pre-procedure area in the procedure room in the                         endoscopy suite. Mental Status Examination: alert and                         oriented. Airway Examination: normal oropharyngeal                         airway and neck mobility. Respiratory Examination:                         clear to auscultation. CV Examination: normal.  Prophylactic Antibiotics: The patient does not require                         prophylactic antibiotics. Prior Anticoagulants: The                         patient  has taken no previous anticoagulant or                         antiplatelet agents. ASA Grade Assessment: II - A                         patient with mild systemic disease. After reviewing                         the risks and benefits, the patient was deemed in                         satisfactory condition to undergo the procedure. The                         anesthesia plan was to use general anesthesia.                         Immediately prior to administration of medications,                         the patient was re-assessed for adequacy to receive                         sedatives. The heart rate, respiratory rate, oxygen                         saturations, blood pressure, adequacy of pulmonary                         ventilation, and response to care were monitored                         throughout the procedure. The physical status of the                         patient was re-assessed after the procedure.                        After obtaining informed consent, the endoscope was                         passed under direct vision. Throughout the procedure,                         the patient's blood pressure, pulse, and oxygen                         saturations were monitored continuously. The Endoscope                         was introduced through the mouth, and advanced to the  second part of duodenum. The upper GI endoscopy was                         accomplished without difficulty. The patient tolerated                         the procedure well. Findings:      The duodenal bulb and examined duodenum were normal.      A 3 cm paraesophjageal hiatal hernia was found. The proximal extent of       the gastric folds (end of tubular esophagus) was 33 cm from the       incisors. The hiatal narrowing was 36 cm from the incisors. The Z-line       was 33 cm from the incisors.      The stomach was normal.      The cardia and gastric fundus were normal on  retroflexion.      Esophagogastric landmarks were identified: the gastroesophageal junction       was found at 36 cm from the incisors.      The gastroesophageal junction and examined esophagus were normal.       Biopsies were taken with a cold forceps for histology. Impression:            - Normal duodenal bulb and examined duodenum.                        - 3 cm hiatal hernia.                        - Normal stomach.                        - Esophagogastric landmarks identified.                        - Normal gastroesophageal junction and esophagus.                        - No specimens collected. Recommendation:        - Discharge patient to home (with escort).                        - Resume previous diet today.                        - Continue present medications.                        - Await pathology results.                        - Refer to a surgeon at appointment to be scheduled to                         evaluate for hernia repair. Procedure Code(s):     --- Professional ---                        [phone], Esophagogastroduodenoscopy, flexible,                         transoral; diagnostic, including  collection of                         specimen(s) by brushing or washing, when performed                         (separate procedure) Diagnosis Code(s):     --- Professional ---                        K44.9, Diaphragmatic hernia without obstruction or                         gangrene                        R13.14, Dysphagia, pharyngoesophageal phase CPT copyright 2019 American Medical Association. All rights reserved. The codes documented in this report are preliminary and upon coder review may  be revised to meet current compliance requirements. Dr. Libby Maw Toney Reil MD, MD 08/10/2021 10:58:26 AM This report has been signed electronically. Number of Addenda: 0 Note Initiated On: 08/10/2021 10:28 AM Estimated Blood Loss:  Estimated blood loss: none. Estimated  blood loss: none.      [facility]

## 2021-08-10 NOTE — Anesthesia Preprocedure Evaluation (Signed)
Anesthesia Evaluation  Patient identified by MRN, date of birth, ID band Patient awake    Reviewed: Allergy & Precautions, NPO status , Patient's Chart, lab work & pertinent test results  History of Anesthesia Complications (+) PONV and history of anesthetic complications  Airway Mallampati: III  TM Distance: >3 FB Neck ROM: full    Dental  (+) Chipped   Pulmonary neg pulmonary ROS, neg shortness of breath,    Pulmonary exam normal        Cardiovascular Exercise Tolerance: Good (-) angina(-) Past MI negative cardio ROS Normal cardiovascular exam     Neuro/Psych negative neurological ROS  negative psych ROS   GI/Hepatic Neg liver ROS, GERD  Controlled,  Endo/Other  negative endocrine ROS  Renal/GU negative Renal ROS  negative genitourinary   Musculoskeletal   Abdominal   Peds  Hematology negative hematology ROS (+)   Anesthesia Other Findings Past Medical History: No date: GERD (gastroesophageal reflux disease) No date: Hemorrhoid No date: IBS (irritable bowel syndrome) No date: Rosacea  Past Surgical History: 1993: CESAREAN SECTION 2002: LAPAROSCOPIC HYSTERECTOMY  BMI    Body Mass Index: 24.37 kg/m      Reproductive/Obstetrics negative OB ROS                            Anesthesia Physical Anesthesia Plan  ASA: 2  Anesthesia Plan: General   Post-op Pain Management:    Induction: Intravenous  PONV Risk Score and Plan: Propofol infusion and TIVA  Airway Management Planned: Natural Airway and Nasal Cannula  Additional Equipment:   Intra-op Plan:   Post-operative Plan:   Informed Consent: I have reviewed the patients History and Physical, chart, labs and discussed the procedure including the risks, benefits and alternatives for the proposed anesthesia with the patient or authorized representative who has indicated his/her understanding and acceptance.     Dental  Advisory Given  Plan Discussed with: Anesthesiologist, CRNA and Surgeon  Anesthesia Plan Comments: (Patient consented for risks of anesthesia including but not limited to:  - adverse reactions to medications - risk of airway placement if required - damage to eyes, teeth, lips or other oral mucosa - nerve damage due to positioning  - sore throat or hoarseness - Damage to heart, brain, nerves, lungs, other parts of body or loss of life  Patient voiced understanding.)       Anesthesia Quick Evaluation

## 2021-08-10 NOTE — H&P (Signed)
  [name], MD [address]  [address]  [place], [place] [address]  Main: [phone]  Fax: [phone] Pager: [phone]  Primary Care Physician:  [name]., MD Primary Gastroenterologist:  Dr. [name]  Pre-Procedure History & Physical: HPI:  Sharon Branch is a 61 y.o. female is here for an endoscopy.   Past Medical History:  Diagnosis Date   GERD (gastroesophageal reflux disease)    Hemorrhoid    IBS (irritable bowel syndrome)    Rosacea     Past Surgical History:  Procedure Laterality Date   CESAREAN SECTION  1993   LAPAROSCOPIC HYSTERECTOMY  2002    Prior to Admission medications   Medication Sig Start Date End Date Taking? Authorizing Provider  escitalopram (LEXAPRO) 10 MG tablet Take 1 tablet (10 mg total) by mouth daily. 09/17/20   Vena Austria, MD  famotidine (PEPCID) 20 MG tablet Take 1 tablet (20 mg total) by mouth 2 (two) times daily. Patient not taking: Reported on 05/06/2021 11/12/20   [name]., MD  pantoprazole (PROTONIX) 40 MG tablet Take 1 tablet (40 mg total) by mouth every morning. 05/06/21   [name]., MD    Allergies as of 08/05/2021 - Review Complete 08/05/2021  Allergen Reaction Noted   Codeine Nausea And Vomiting 09/05/2013    Family History  Problem Relation Age of Onset   Breast cancer Mother     Social History   Socioeconomic History   Marital status: Legally Separated    Spouse name: Not on file   Number of children: Not on file   Years of education: Not on file   Highest education level: Not on file  Occupational History   Not on file  Tobacco Use   Smoking status: Never   Smokeless tobacco: Never  Vaping Use   Vaping Use: Never used  Substance and Sexual Activity   Alcohol use: No   Drug use: No   Sexual activity: Not Currently  Other Topics Concern   Not on file  Social History Narrative   Not on file   Social Determinants of Health   Financial Resource  Strain: Not on file  Food Insecurity: Not on file  Transportation Needs: Not on file  Physical Activity: Not on file  Stress: Not on file  Social Connections: Not on file  Intimate Partner Violence: Not on file    Review of Systems: See HPI, otherwise negative ROS  Physical Exam: BP 123/69   Pulse (!) 57   Temp (!) 97 F (36.1 C) (Temporal)   Resp 18   Ht 5\' 1"  (1.549 m)   Wt 58.5 kg   SpO2 100%   BMI 24.37 kg/m  General:   Alert,  pleasant and cooperative in NAD Head:  Normocephalic and atraumatic. Neck:  Supple; no masses or thyromegaly. Lungs:  Clear throughout to auscultation.    Heart:  Regular rate and rhythm. Abdomen:  Soft, nontender and nondistended. Normal bowel sounds, without guarding, and without rebound.   Neurologic:  Alert and  oriented x4;  grossly normal neurologically.  Impression/Plan: Sharon Branch is here for an endoscopy to be performed for chronic intermittent episodes of epigastric discomfort, sensation of food stuck in the epigastric area  Risks, benefits, limitations, and alternatives regarding  endoscopy have been reviewed with the patient.  Questions have been answered.  All parties agreeable.   Lucretia Field, MD  08/10/2021, 10:27 AM

## 2021-08-10 NOTE — Transfer of Care (Signed)
Immediate Anesthesia Transfer of Care Note  Patient: Sharon Branch  Procedure(s) Performed: ESOPHAGOGASTRODUODENOSCOPY (EGD) WITH PROPOFOL  Patient Location: PACU and Endoscopy Unit  Anesthesia Type:MAC  Level of Consciousness: drowsy  Airway & Oxygen Therapy: Patient Spontanous Breathing and Patient connected to nasal cannula oxygen  Post-op Assessment: Report given to RN and Post -op Vital signs reviewed and stable  Post vital signs: Reviewed and stable  Last Vitals:  Vitals Value Taken Time  BP    Temp    Pulse    Resp    SpO2      Last Pain:  Vitals:   08/10/21 1000  TempSrc: Temporal  PainSc: 0-No pain         Complications: No notable events documented.

## 2021-08-10 NOTE — Anesthesia Postprocedure Evaluation (Signed)
Anesthesia Post Note  Patient: Sharon Branch  Procedure(s) Performed: ESOPHAGOGASTRODUODENOSCOPY (EGD) WITH PROPOFOL  Patient location during evaluation: Endoscopy Anesthesia Type: General Level of consciousness: awake and alert Pain management: pain level controlled Vital Signs Assessment: post-procedure vital signs reviewed and stable Respiratory status: spontaneous breathing, nonlabored ventilation, respiratory function stable and patient connected to nasal cannula oxygen Cardiovascular status: blood pressure returned to baseline and stable Postop Assessment: no apparent nausea or vomiting Anesthetic complications: no   No notable events documented.   Last Vitals:  Vitals:   08/10/21 1113 08/10/21 1127  BP: (!) 95/51 127/75  Pulse:    Resp:    Temp:    SpO2:      Last Pain:  Vitals:   08/10/21 1127  TempSrc:   PainSc: 0-No pain                 Sharon Branch

## 2021-08-11 ENCOUNTER — Encounter: Payer: Self-pay | Admitting: Gastroenterology

## 2021-08-11 ENCOUNTER — Telehealth: Payer: Self-pay

## 2021-08-11 DIAGNOSIS — K449 Diaphragmatic hernia without obstruction or gangrene: Secondary | ICD-10-CM

## 2021-08-11 LAB — SURGICAL PATHOLOGY

## 2021-08-11 NOTE — Telephone Encounter (Signed)
-----   Message from Lin Landsman, MD sent at 08/11/2021  2:43 PM EDT ----- Recommend referral to general surgery to evaluate for paraesophageal hernia repair Also, please inform patient that the pathology results from upper endoscopy of her stomach came back normal.  Rohini Vanga

## 2021-08-11 NOTE — Telephone Encounter (Signed)
Patient has been notified of pathology results.  Referral has been sent to Dr. Hurman Horn office.  Thanks, [place], [place]

## 2021-08-24 ENCOUNTER — Ambulatory Visit: Payer: BC Managed Care – PPO | Admitting: Surgery

## 2021-08-24 DIAGNOSIS — K449 Diaphragmatic hernia without obstruction or gangrene: Secondary | ICD-10-CM | POA: Diagnosis not present

## 2021-08-26 ENCOUNTER — Encounter: Payer: Self-pay | Admitting: Surgery

## 2021-08-26 NOTE — Progress Notes (Signed)
Patient ID: [name], female   DOB: 01-Jul-1960, 61 y.o.   MRN: [id number]  HPI Sharon Branch is a 61 y.o. female seen in consultation at the request of Dr. Allegra Lai.  She recently underwent upper endoscopy.  Please note that I have personally reviewed the images showing a moderate hiatal hernia seems to be a type I.  She also had biopsy showing no evidence of malignancy. She does experience epigastric pain that is intermittent and mild to moderate intensity and dull type.  She has been started on Protonix with some mixed results.  She also reports that there is a sensation of food stuck in her upper belly and lower chest.  She also experiences significant regurgitation. Prior surgical history includes a hysterectomy and C-section.  She is able to perform more than 4 METS of activity without any shortness of breath or chest pain. She does have a CBC that is normal and BMP and CMP that shows low albumin. She did have RUQ U/S that I have pers, reviewed showing nml GB HPI  Past Medical History:  Diagnosis Date   GERD (gastroesophageal reflux disease)    Hemorrhoid    IBS (irritable bowel syndrome)    Rosacea     Past Surgical History:  Procedure Laterality Date   CESAREAN SECTION  1993   ESOPHAGOGASTRODUODENOSCOPY (EGD) WITH PROPOFOL N/A 08/10/2021   Procedure: ESOPHAGOGASTRODUODENOSCOPY (EGD) WITH PROPOFOL;  Surgeon: Toney Reil, MD;  Location: [facility] ENDOSCOPY;  Service: Gastroenterology;  Laterality: N/A;   LAPAROSCOPIC HYSTERECTOMY  2002    Family History  Problem Relation Age of Onset   Breast cancer Mother     Social History Social History   Tobacco Use   Smoking status: Never   Smokeless tobacco: Never  Vaping Use   Vaping Use: Never used  Substance Use Topics   Alcohol use: No   Drug use: No    Allergies  Allergen Reactions   Codeine Nausea And Vomiting    Current Outpatient Medications  Medication Sig Dispense Refill   escitalopram (LEXAPRO) 10 MG tablet  Take 1 tablet (10 mg total) by mouth daily. 90 tablet 3   pantoprazole (PROTONIX) 40 MG tablet TAKE 1 TABLET BY MOUTH EVERY DAY IN THE MORNING 90 tablet 1   No current facility-administered medications for this visit.     Review of Systems Full ROS  was asked and was negative except for the information on the HPI  Physical Exam There were no vitals taken for this visit. CONSTITUTIONAL: NAD. EYES: Pupils are equal, round, , Sclera are non-icteric. EARS, NOSE, MOUTH AND THROAT:  The oral mucosa is pink and moist. Hearing is intact to voice. LYMPH NODES:  Lymph nodes in the neck are normal. RESPIRATORY:  Lungs are clear. There is normal respiratory effort, with equal breath sounds bilaterally, and without pathologic use of accessory muscles. CARDIOVASCULAR: Heart is regular without murmurs, gallops, or rubs. GI: The abdomen is  soft, nontender, and nondistended. There are no palpable masses. There is no hepatosplenomegaly. There are normal bowel sounds in all quadrants. GU: Rectal deferred.   MUSCULOSKELETAL: Normal muscle strength and tone. No cyanosis or edema.   SKIN: Turgor is good and there are no pathologic skin lesions or ulcers. NEUROLOGIC: Motor and sensation is grossly normal. Cranial nerves are grossly intact. PSYCH:  Oriented to person, place and time. Affect is normal.  Data Reviewed  I have personally reviewed the patient's imaging, laboratory findings and medical records.    Assessment/Plan  Sharon Branch is a 61 year old female with what seems to be a type I paraesophageal hernia with significant symptoms.  Discussed with the patient in detail about her disease process and she wishes to proceed with further work-up.  We will perform a CT scan of the abdomen pelvis as well as a barium swallow.  I do think that from a reflux perspective anatomical perspective it would be reasonable to potentially offer her a robotic repair.  Before entertaining any surgical intervention I will like to  fully assess her anatomy and function of the esophagus.  At this time there is no need for emergent surgical intervention.  A copy of this report was sent to the referring provider.  Please note that I spent 55 minutes in this encounter including personally reviewing imaging studies, records, counseling the patient, placing orders and performing appropriate documentation    Sterling Big, MD FACS General Surgeon 08/26/2021, 9:50 AM

## 2021-09-02 ENCOUNTER — Other Ambulatory Visit
Admission: RE | Admit: 2021-09-02 | Discharge: 2021-09-02 | Disposition: A | Discharge status: Home / Self Care | Payer: BC Managed Care – PPO | Source: Ambulatory Visit | Attending: Surgery | Admitting: Surgery

## 2021-09-02 ENCOUNTER — Ambulatory Visit
Admission: RE | Admit: 2021-09-02 | Discharge: 2021-09-02 | Disposition: A | Discharge status: Home / Self Care | Payer: BC Managed Care – PPO | Source: Ambulatory Visit | Attending: Surgery | Admitting: Surgery

## 2021-09-02 DIAGNOSIS — Z01818 Encounter for other preprocedural examination: Secondary | ICD-10-CM | POA: Diagnosis not present

## 2021-09-02 DIAGNOSIS — K449 Diaphragmatic hernia without obstruction or gangrene: Secondary | ICD-10-CM

## 2021-09-02 DIAGNOSIS — K219 Gastro-esophageal reflux disease without esophagitis: Secondary | ICD-10-CM | POA: Diagnosis not present

## 2021-09-02 LAB — BASIC METABOLIC PANEL
Anion gap: 4 — ABNORMAL LOW (ref 5–15)
BUN: 23 mg/dL — ABNORMAL HIGH (ref 6–20)
CO2: 29 mmol/L (ref 22–32)
Calcium: 8.4 mg/dL — ABNORMAL LOW (ref 8.9–10.3)
Chloride: 107 mmol/L (ref 98–111)
Creatinine, Ser: 0.6 mg/dL (ref 0.44–1.00)
GFR, Estimated: >60 mL/min (ref >60)
Glucose, Bld: 96 mg/dL (ref 70–99)
Potassium: 4.2 mmol/L (ref 3.5–5.1)
Sodium: 140 mmol/L (ref 135–145)

## 2021-09-03 ENCOUNTER — Telehealth: Payer: Self-pay

## 2021-09-03 DIAGNOSIS — K449 Diaphragmatic hernia without obstruction or gangrene: Secondary | ICD-10-CM

## 2021-09-03 NOTE — Telephone Encounter (Signed)
-----   Message from Toney Reil, MD sent at 09/02/2021 12:59 PM EDT ----- Regarding: RE: Morrie Sheldon  Please order esophageal manometry Dx: pre op evaluation for hiatal hernia  Thanks RV ----- Message ----- From: Henrene Dodge, MD Sent: 09/01/2021   7:44 PM EDT To: Toney Reil, MD  Hi Dr. Allegra Lai,  I'm covering for Dr. Everlene Farrier while he's out of country.  He'll be back next week.  I think it'd be helpful to get manometry study please.  Thanks!  Jose   ----- Message ----- From: Toney Reil, MD Sent: 08/26/2021   4:58 PM EDT To: Leafy Ro, MD  Thanks for seeing her.  Do you want her to get esophageal manometry before hernia surgery?  I can arrange that, please let me know  RV ----- Message ----- From: Leafy Ro, MD Sent: 08/26/2021   9:58 AM EDT To: Toney Reil, MD

## 2021-09-03 NOTE — Telephone Encounter (Signed)
Placed referral to [facility] for esophageal manometry

## 2021-09-04 ENCOUNTER — Ambulatory Visit
Admission: RE | Admit: 2021-09-04 | Discharge: 2021-09-04 | Disposition: A | Discharge status: Home / Self Care | Payer: BC Managed Care – PPO | Source: Ambulatory Visit | Attending: Surgery | Admitting: Surgery

## 2021-09-04 DIAGNOSIS — K449 Diaphragmatic hernia without obstruction or gangrene: Secondary | ICD-10-CM | POA: Insufficient documentation

## 2021-09-04 MED ORDER — IOHEXOL 300 MG/ML  SOLN
75.0000 mL | Freq: Once | INTRAMUSCULAR | Status: AC | PRN
Start: 2021-09-04 — End: 2021-09-04
  Administered 2021-09-04: 75 mL via INTRAVENOUS

## 2021-09-10 ENCOUNTER — Telehealth: Payer: Self-pay

## 2021-09-10 ENCOUNTER — Ambulatory Visit
Admission: RE | Admit: 2021-09-10 | Discharge: 2021-09-10 | Disposition: A | Discharge status: Home / Self Care | Payer: BC Managed Care – PPO | Source: Ambulatory Visit | Attending: Surgery | Admitting: Surgery

## 2021-09-10 DIAGNOSIS — K7689 Other specified diseases of liver: Secondary | ICD-10-CM | POA: Diagnosis not present

## 2021-09-10 DIAGNOSIS — K5730 Diverticulosis of large intestine without perforation or abscess without bleeding: Secondary | ICD-10-CM | POA: Diagnosis not present

## 2021-09-10 DIAGNOSIS — K449 Diaphragmatic hernia without obstruction or gangrene: Secondary | ICD-10-CM | POA: Insufficient documentation

## 2021-09-10 MED ORDER — IOHEXOL 300 MG/ML  SOLN
100.0000 mL | Freq: Once | INTRAMUSCULAR | Status: AC | PRN
Start: 2021-09-10 — End: 2021-09-10
  Administered 2021-09-10: 100 mL via INTRAVENOUS

## 2021-09-10 NOTE — Telephone Encounter (Signed)
Patient notified of CT results. Keep scheduled appointment .

## 2021-09-11 ENCOUNTER — Other Ambulatory Visit: Payer: BC Managed Care – PPO

## 2021-09-14 ENCOUNTER — Telehealth: Payer: Self-pay | Admitting: Surgery

## 2021-09-14 ENCOUNTER — Ambulatory Visit: Payer: BC Managed Care – PPO | Admitting: Surgery

## 2021-09-14 ENCOUNTER — Other Ambulatory Visit: Payer: Self-pay

## 2021-09-14 ENCOUNTER — Encounter: Payer: Self-pay | Admitting: Surgery

## 2021-09-14 VITALS — BP 120/75 | HR 57 | Temp 98.1°F | Ht 61.0 in | Wt 133.0 lb

## 2021-09-14 DIAGNOSIS — K449 Diaphragmatic hernia without obstruction or gangrene: Secondary | ICD-10-CM | POA: Diagnosis not present

## 2021-09-14 NOTE — Patient Instructions (Signed)
Our surgery scheduler will call you within 24-48 hours to schedule your surgery. Please have the [place] surgery sheet available when speaking with her.    Eating Plan After Nissen Fundoplication After a Nissen fundoplication procedure, it is common to have some difficulty swallowing. The part of your body that moves food and liquid from your mouth to your stomach (esophagus) will be swollen and may feel tight. It will take several weeks or months for your esophagus and stomach to heal. By following a special eating plan, you can prevent problems such as pain, swelling or pressure in the abdomen (bloating), gas, nausea, or diarrhea. What are tips for following this plan? Cooking Cook all foods until they are soft. Remove skins and seeds from fruits and vegetables before eating. Remove skin and gristle from meats. Grind or finely mince meats before eating. Avoid over-cooking meat. Dry, tough meat is more difficult to swallow. Avoid using oil when cooking, or use only a small amount of oil. Avoid using seasoning when cooking, or use only a small amount of seasoning. Toast bread before eating. This makes it easier to swallow. Meal planning  Eat 6-8 small meals throughout the day. Right after the surgery, have a few meals that are only clear liquids. Clear liquids include: Water. Clear fruit juice, no pulp. Chicken, beef, or vegetable broth. Gelatin. Decaffeinated tea or coffee without milk. Popsicles or shaved ice. Depending on your progress, you may move to a full liquid diet as told by your health care provider. This includes clear liquids and the following: Dairy and alternative milks, such as soy milk. Strained creamed soups. Ice cream or sherbet. Pudding. Nutritional supplement drinks. Yogurt. A few days after surgery, you may be able to start eating a diet of soft foods. You may need to eat according to this plan for several weeks. Do not eat sweets or sweetened drinks at the  beginning of a meal. Doing that may cause your stomach to empty faster than it should (dumping syndrome). Lifestyle Always sit upright when eating or drinking. Eat slowly. Take small bites and chew food well before swallowing. Do not lie down after eating. Stay sitting up for 30 minutes or longer after each meal. Sip fluids between meals. Limit how much you drink at one time. With meals and snacks, have 4-8 oz (120-240 mL). This is equal to  cup-1 cup. Do not mix solid foods and liquids in the same mouthful. Drink enough fluid to keep your urine pale yellow. Do not chew gum or drink fluids through a straw. Doing those things may cause you to swallow extra air. General information Do not drink carbonated drinks or alcohol. Avoid foods and drinks that contain caffeine and chocolate. Avoid foods and drinks that contain citrus or tomato. Allow hot soups and drinks to cool before eating. Avoid foods that cause gas, such as beans, peas, broccoli, or cabbage. If dairy milk products cause diarrhea, avoid them or eat them in small amounts. Recommended foods Fruits Any soft-cooked fruits after skins and seeds are removed. Fruit juice. Vegetables Any soft-cooked vegetables after skins and seeds are removed. Vegetable juice. Grains Cooked cereals. Dry cereals softened with liquid. Cooked pasta, rice, or other grains. Toasted bread. Bland crackers, such as soda or graham crackers. Meats and other protein foods Tender cuts of meat, poultry, or fish after bones, skin, and gristle are removed. Poached, boiled, or scrambled eggs. Canned fish. Tofu. Creamy nut butters. Dairy Milk. Yogurt. Cottage cheese. Mild cheeses. Beverages Nutritional supplement drinks.  Decaffeinated tea or coffee. Sports drinks. Fats and oils Butter. Margarine. Mayonnaise. Vegetable oil. Smooth salad dressing. Sweets and desserts Plain hard candy. Marshmallows. Pudding. Ice cream. Gelatin. Sherbet. Seasoning and other  foods Salt. Light seasonings. Mustard. Vinegar. The items listed above may not be a complete list of recommended foods and beverages. Contact a dietitian for more information. Foods to avoid Fruits Oranges. Grapefruit. Lemons. Limes. Citrus juices. Dried fruit. Crunchy, raw fruits. Vegetables Tomato sauce. Tomato juice. Broccoli. Cauliflower. Cabbage. Brussels sprouts. Crunchy, raw vegetables. Grains High-fiber or bran cereal. Cereal with nuts, dried fruit, or coconut. Sweet breads, rolls, coffee cake, or donuts. Chewy or crusty breads. Popcorn. Meats and other protein foods Beans, peas, and lentils. Tough or fatty meats. Fried meats, chicken, or fish. Fried eggs. Nuts and seeds. Crunchy nut butters. Dairy Chocolate milk. Yogurt with chunks of fruit, nuts, seeds, or coconut. Strong cheeses. Beverages Carbonated soft drinks. Alcohol. Cocoa. Hot drinks. Fats and oils Bacon fat. Lard. Sweets and desserts Chocolate. Candy with nuts, coconut, or seeds. Peppermint. Cookies. Cakes. Pie crust. Seasoning and other foods Heavy seasonings. Chili sauce. Ketchup. Barbecue sauce. Rosita Fire. Horseradish. The items listed above may not be a complete list of foods and beverages to avoid. Contact a dietitian for more information. Summary Following this eating plan after a Nissen fundoplication is an important part of healing after surgery. After surgery, you will start with a clear liquid diet before you progress to full liquids and soft foods. You may need to eat soft foods for several weeks. Avoid eating foods that cause irritation, gas, nausea, diarrhea, or swelling or pressure in the abdomen (bloating), and avoid foods that are difficult to swallow. Talk with a dietitian about which dietary choices are best for you. This information is not intended to replace advice given to you by your health care provider. Make sure you discuss any questions you have with your health care provider. Document Revised:  09/02/2019 Document Reviewed: 09/02/2019 Elsevier Patient Education  2023 [employer] Laparoscopic Nissen Fundoplication  Laparoscopic Nissen fundoplication is a surgery to relieve heartburn and other problems caused by fluid from your stomach (gastric fluids) flowing up into your esophagus. The esophagus is the part of the body that moves food from the mouth to the stomach. Normally, the muscle that sits between the stomach and the esophagus (lower esophageal sphincter, LES) keeps stomach fluids in the stomach. In some people, the LES does not work properly, and stomach fluids flow up into the esophagus (reflux). This can happen when part of the stomach bulges through the LES (hiatal hernia). The backward flow of stomach fluids can cause a type of severe and long-lasting heartburn that is called gastroesophageal reflux disease (GERD). You may need this surgery if other treatments for GERD have not helped. In this procedure, the upper part of your stomach is wrapped around the lower end of your esophagus and stitched together (sutured). This tightens the connection between your esophagus and stomach to prevent stomach acid reflux. Tell a health care provider about: Any allergies you have. All medicines you are taking, including vitamins, herbs, eye drops, creams, and over-the-counter medicines. Any problems you or family members have had with anesthetic medicines. Any blood disorders you have. Any surgeries you have had. Any medical conditions you have. Whether you are pregnant or may be pregnant. What are the risks? Generally, this is a safe procedure. However, problems may occur, including: Infection. Bleeding. Damage to other structures or organs. This can include damage to the lung,  causing a collapsed lung. Trouble swallowing (dysphagia). Blood clots. Allergic reactions to medicines. What happens before the procedure? Staying hydrated Follow instructions from your health care provider  about hydration, which may include: Up to two hours before the procedure - you may continue to drink clear liquids, such as water, clear fruit juice, black coffee and plain tea. Eating and drinking restrictions Follow instructions from your health care provider about eating and drinking, which may include: 8 hours before the procedure - stop eating heavy meals or foods, such as meat, fried foods, or fatty foods. 6 hours before the procedure - stop eating light meals or foods, such as toast or cereal. 6 hours before the procedure - stop drinking milk or drinks that contain milk. 2 hours before the procedure - stop drinking clear liquids. Medicines Ask your health care provider about: Changing or stopping your regular medicines. This is especially important if you are taking diabetes medicines or blood thinners. Taking medicines such as aspirin and ibuprofen. These medicines can thin your blood. Do not take these medicines unless your health care provider tells you to take them. Taking over-the-counter medicines, vitamins, herbs, and supplements. Tests Your health care provider will do tests to plan the procedure. This may include: An exam using a flexible scope passed down your esophagus into your stomach (endoscopy). Imaging studies. General instructions Plan to have a responsible adult take you home from the hospital or clinic. Ask your health care provider: How your surgery site will be marked. What steps will be taken to help prevent infection. These steps may include: Removing hair at the surgery site. Washing skin with a germ-killing soap. Taking antibiotic medicine. Do not use any products that contain nicotine or tobacco for at least 4 weeks before the procedure. These products include cigarettes, chewing tobacco, and vaping devices, such as e-cigarettes. If you need help quitting, ask your health care provider. What happens during the procedure? An IV will be inserted into one of  your veins. You will be given medicine in your IV to help you relax (sedative) just before the procedure and a medicine to make you fall asleep (general anesthetic). You may have a tube placed through your nose into your stomach to drain stomach acid during the procedure (nasogastric tube). The surgeon will make a small incision in your abdomen and insert a tube through the incision. Your abdomen will be filled with a gas. This helps the surgeon see your organs better, and it makes more space to work. The surgeon will insert a thin, lighted tube (laparoscope) through the small incision. This allows your surgeon to see into your abdomen. The surgeon will make several other small incisions in your abdomen to insert the other instruments that are needed during the procedure. Another instrument (dilator) will be passed through your mouth and down your esophagus into the upper part of your stomach. The dilator will prevent your LES from being closed too tightly during surgery. The upper part of your stomach will be wrapped around the lower part of your esophagus and will be stitched into place. This will strengthen the lower esophageal sphincter and prevent reflux. If you have a hiatal hernia, it will be repaired. The gas will be released from your abdomen. All instruments will be removed, and the incisions will be closed with stitches (sutures). A bandage (dressing) will be placed on your skin over the incisions. The procedure may vary among health care providers and hospitals. What happens after the procedure? Your blood  pressure, heart rate, breathing rate, and blood oxygen level will be monitored until you leave the hospital or clinic. You will be given pain medicine as needed. Your IV will be kept in until you are able to drink fluids. You will be encouraged to get up and walk around as soon as possible. Summary Laparoscopic Nissen fundoplication is a surgery to relieve heartburn and other  problems caused by gastric fluids flowing up into your esophagus. You may need this surgery if other treatments for GERD have not helped. Follow instructions from your health care provider about eating and drinking before the procedure. Your surgeon will use a thin, lighted tube (laparoscope) that is inserted through a small incision, allowing the surgeon to see into your abdomen. This information is not intended to replace advice given to you by your health care provider. Make sure you discuss any questions you have with your health care provider. Document Revised: 09/02/2019 Document Reviewed: 09/02/2019 Elsevier Patient Education  2023 [employer].

## 2021-09-14 NOTE — Progress Notes (Signed)
Outpatient Surgical Follow Up  09/14/2021  Sharon Branch is an 61 y.o. female.   Chief Complaint  Patient presents with   Follow-up    Hiatal hernia    HPI: Sharon Branch is a 61-year-old female well-known to me with history of paraesophageal hernia.  She continues to postprandial pain.  Pain is in the epigastric area moderate intensity and sharp.  She can only eat half of her ration.  She has completed appropriate work-up to include barium swallow and CT scan.  Please note that I have personally reviewed the images and shared them with the patient.  There is a type III paraesophageal hernia with the entire stomach within the mediastinum in an upside down stomach configuration. There is no strictures.  She denies any dysphagia.  She is able to perform more than 4 METS of activity without any shortness of breath or chest pain  Past Medical History:  Diagnosis Date   GERD (gastroesophageal reflux disease)    Hemorrhoid    IBS (irritable bowel syndrome)    Rosacea     Past Surgical History:  Procedure Laterality Date   CESAREAN SECTION  1993   ESOPHAGOGASTRODUODENOSCOPY (EGD) WITH PROPOFOL N/A 08/10/2021   Procedure: ESOPHAGOGASTRODUODENOSCOPY (EGD) WITH PROPOFOL;  Surgeon: Vanga, Rohini Reddy, MD;  Location: [facility] ENDOSCOPY;  Service: Gastroenterology;  Laterality: N/A;   LAPAROSCOPIC HYSTERECTOMY  2002    Family History  Problem Relation Age of Onset   Breast cancer Mother     Social History:  reports that she has never smoked. She has never used smokeless tobacco. She reports that she does not drink alcohol and does not use drugs.  Allergies:  Allergies  Allergen Reactions   Codeine Nausea And Vomiting    Medications reviewed.    ROS Full ROS performed and is otherwise negative other than what is stated in HPI   BP 120/75   Pulse (!) 57   Temp 98.1 F (36.7 C) (Oral)   Ht 5' 1" (1.549 m)   Wt 133 lb (60.3 kg)   SpO2 99%   BMI 25.13 kg/m   Physical Exam Vitals and  nursing note reviewed. Exam conducted with a chaperone present.  Constitutional:      General: She is not in acute distress.    Appearance: Normal appearance. She is not ill-appearing.  Eyes:     General: No scleral icterus.       Right eye: No discharge.        Left eye: No discharge.  Cardiovascular:     Rate and Rhythm: Normal rate and regular rhythm.     Heart sounds: No murmur heard.    No friction rub.  Pulmonary:     Effort: Pulmonary effort is normal. No respiratory distress.     Breath sounds: Normal breath sounds. No stridor.  Abdominal:     General: Abdomen is flat. There is no distension.     Palpations: Abdomen is soft. There is no mass.     Tenderness: There is no abdominal tenderness. There is no guarding or rebound.     Hernia: No hernia is present.  Musculoskeletal:     Cervical back: Normal range of motion and neck supple. No rigidity or tenderness.  Lymphadenopathy:     Cervical: No cervical adenopathy.  Skin:    General: Skin is warm and dry.     Capillary Refill: Capillary refill takes less than 2 seconds.  Neurological:     General: No focal deficit present.       Mental Status: She is alert and oriented to person, place, and time.  Psychiatric:        Mood and Affect: Mood normal.        Behavior: Behavior normal.        Thought Content: Thought content normal.        Judgment: Judgment normal.    Assessment/Plan: Sharon Branch is a 61 year old female with  paraesophageal hernia type III w upside down stomach with significant symptoms. I Discussed with the patient in detail about her disease process and I do definitely recommend repair of days symptomatic paraesophageal hernia.  I do think we can still do it robotically.  Procedure discussed with the patient in detail.  Risks, benefits and possible implications including but not limited to: Bleeding, infection, pneumothorax, bowel or esophageal injuries, chronic pain and bloating.  Discussed with her about diet  modification postoperatively She understands and wishes to proceed.  We will plan for repair in about 2 to 3 weeks Please note that I spent 45 minutes in this encounter including personally reviewing imaging studies, records, counseling the patient, placing orders and performing appropriate documentation    Sterling Big, MD [facility] General Surgeon

## 2021-09-14 NOTE — Telephone Encounter (Signed)
Patient has been advised of Pre-Admission date/time, and Surgery date.  Surgery Date: 10/01/21 Preadmission Testing Date: 09/21/21 (phone 1p-5p)  Patient has been made aware to call [phone], between 1-3:00pm the day before surgery, to find out what time to arrive for surgery.

## 2021-09-14 NOTE — H&P (View-Only) (Signed)
Outpatient Surgical Follow Up  09/14/2021  Sharon Branch is an 61 y.o. female.   Chief Complaint  Patient presents with   Follow-up    Hiatal hernia    HPI: Sharon Branch is a 61 year old female well-known to me with history of paraesophageal hernia.  She continues to postprandial pain.  Pain is in the epigastric area moderate intensity and sharp.  She can only eat half of her ration.  She has completed appropriate work-up to include barium swallow and CT scan.  Please note that I have personally reviewed the images and shared them with the patient.  There is a type III paraesophageal hernia with the entire stomach within the mediastinum in an upside down stomach configuration. There is no strictures.  She denies any dysphagia.  She is able to perform more than 4 METS of activity without any shortness of breath or chest pain  Past Medical History:  Diagnosis Date   GERD (gastroesophageal reflux disease)    Hemorrhoid    IBS (irritable bowel syndrome)    Rosacea     Past Surgical History:  Procedure Laterality Date   CESAREAN SECTION  1993   ESOPHAGOGASTRODUODENOSCOPY (EGD) WITH PROPOFOL N/A 08/10/2021   Procedure: ESOPHAGOGASTRODUODENOSCOPY (EGD) WITH PROPOFOL;  Surgeon: Toney Reil, MD;  Location: [facility] ENDOSCOPY;  Service: Gastroenterology;  Laterality: N/A;   LAPAROSCOPIC HYSTERECTOMY  2002    Family History  Problem Relation Age of Onset   Breast cancer Mother     Social History:  reports that she has never smoked. She has never used smokeless tobacco. She reports that she does not drink alcohol and does not use drugs.  Allergies:  Allergies  Allergen Reactions   Codeine Nausea And Vomiting    Medications reviewed.    ROS Full ROS performed and is otherwise negative other than what is stated in HPI   BP 120/75   Pulse (!) 57   Temp 98.1 F (36.7 C) (Oral)   Ht 5\' 1"  (1.549 m)   Wt 133 lb (60.3 kg)   SpO2 99%   BMI 25.13 kg/m   Physical Exam Vitals and  nursing note reviewed. Exam conducted with a chaperone present.  Constitutional:      General: She is not in acute distress.    Appearance: Normal appearance. She is not ill-appearing.  Eyes:     General: No scleral icterus.       Right eye: No discharge.        Left eye: No discharge.  Cardiovascular:     Rate and Rhythm: Normal rate and regular rhythm.     Heart sounds: No murmur heard.    No friction rub.  Pulmonary:     Effort: Pulmonary effort is normal. No respiratory distress.     Breath sounds: Normal breath sounds. No stridor.  Abdominal:     General: Abdomen is flat. There is no distension.     Palpations: Abdomen is soft. There is no mass.     Tenderness: There is no abdominal tenderness. There is no guarding or rebound.     Hernia: No hernia is present.  Musculoskeletal:     Cervical back: Normal range of motion and neck supple. No rigidity or tenderness.  Lymphadenopathy:     Cervical: No cervical adenopathy.  Skin:    General: Skin is warm and dry.     Capillary Refill: Capillary refill takes less than 2 seconds.  Neurological:     General: No focal deficit present.  Mental Status: She is alert and oriented to person, place, and time.  Psychiatric:        Mood and Affect: Mood normal.        Behavior: Behavior normal.        Thought Content: Thought content normal.        Judgment: Judgment normal.    Assessment/Plan: Sharon Branch is a 61 year old female with  paraesophageal hernia type III w upside down stomach with significant symptoms. I Discussed with the patient in detail about her disease process and I do definitely recommend repair of days symptomatic paraesophageal hernia.  I do think we can still do it robotically.  Procedure discussed with the patient in detail.  Risks, benefits and possible implications including but not limited to: Bleeding, infection, pneumothorax, bowel or esophageal injuries, chronic pain and bloating.  Discussed with her about diet  modification postoperatively She understands and wishes to proceed.  We will plan for repair in about 2 to 3 weeks Please note that I spent 45 minutes in this encounter including personally reviewing imaging studies, records, counseling the patient, placing orders and performing appropriate documentation    Sterling Big, MD [facility] General Surgeon

## 2021-09-21 ENCOUNTER — Encounter
Admission: RE | Admit: 2021-09-21 | Discharge: 2021-09-21 | Disposition: A | Discharge status: Home / Self Care | Payer: BC Managed Care – PPO | Source: Ambulatory Visit | Attending: Surgery | Admitting: Surgery

## 2021-09-21 HISTORY — DX: Personal history of urinary calculi: Z87.442

## 2021-09-21 HISTORY — DX: Personal history of other diseases of the digestive system: Z87.19

## 2021-09-21 HISTORY — DX: Other specified postprocedural states: R11.2

## 2021-09-21 HISTORY — DX: Other specified postprocedural states: Z98.890

## 2021-09-21 NOTE — Patient Instructions (Signed)
Your procedure is scheduled on:10-01-21 Thursday Report to the Registration Desk on the 1st floor of the [facility].Then proceed to the 2nd floor Surgery Desk To find out your arrival time, please call [phone] between 1PM - 3PM on:09-30-21 Wednesday If your arrival time is 6:00 am, do not arrive prior to that time as the [facility] entrance doors do not open until 6:00 am.  REMEMBER: Instructions that are not followed completely may result in serious medical risk, up to and including death; or upon the discretion of your surgeon and anesthesiologist your surgery may need to be rescheduled.  Do not eat food after midnight the night before surgery.  No gum chewing, lozengers or hard candies.  You may however, drink CLEAR liquids up to 2 hours before you are scheduled to arrive for your surgery. Do not drink anything within 2 hours of your scheduled arrival time.  Clear liquids include: - water  - apple juice without pulp - gatorade (not RED colors) - black coffee or tea (Do NOT add milk or creamers to the coffee or tea) Do NOT drink anything that is not on this list.  Do NOT take any medication the day of surgery  One week prior to surgery: Stop Anti-inflammatories (NSAIDS) such as Advil, Aleve, Ibuprofen, Motrin, Naproxen, Naprosyn and Aspirin based products such as Excedrin, Goodys Powder, BC Powder.You may however, take Tylenol if needed for pain up until the day of surgery.  Stop ANY OVER THE COUNTER supplements/vitamins 7 days prior to surgery   No Alcohol for 24 hours before or after surgery.  No Smoking including e-cigarettes for 24 hours prior to surgery.  No chewable tobacco products for at least 6 hours prior to surgery.  No nicotine patches on the day of surgery.  Do not use any "recreational" drugs for at least a week prior to your surgery.  Please be advised that the combination of cocaine and anesthesia may have negative outcomes, up to and including death. If  you test positive for cocaine, your surgery will be cancelled.  On the morning of surgery brush your teeth with toothpaste and water, you may rinse your mouth with mouthwash if you wish. Do not swallow any toothpaste or mouthwash.  Use CHG Soap as directed on instruction sheet.  Do not wear jewelry, make-up, hairpins, clips or nail polish.  Do not wear lotions, powders, or perfumes.   Do not shave body from the neck down 48 hours prior to surgery just in case you cut yourself which could leave a site for infection.  Also, freshly shaved skin may become irritated if using the CHG soap.  Contact lenses, hearing aids and dentures may not be worn into surgery.  Do not bring valuables to the hospital. [facility] is not responsible for any missing/lost belongings or valuables.   Notify your doctor if there is any change in your medical condition (cold, fever, infection).  Wear comfortable clothing (specific to your surgery type) to the hospital.  After surgery, you can help prevent lung complications by doing breathing exercises.  Take deep breaths and cough every 1-2 hours. Your doctor may order a device called an Incentive Spirometer to help you take deep breaths. When coughing or sneezing, hold a pillow firmly against your incision with both hands. This is called "splinting." Doing this helps protect your incision. It also decreases belly discomfort.  If you are being admitted to the hospital overnight, leave your suitcase in the car. After surgery it may  be brought to your room.  If you are being discharged the day of surgery, you will not be allowed to drive home. You will need a responsible adult (18 years or older) to drive you home and stay with you that night.   If you are taking public transportation, you will need to have a responsible adult (18 years or older) with you. Please confirm with your physician that it is acceptable to use public transportation.   Please call the  [place] [facility] at [phone] if you have any questions about these instructions.  Surgery Visitation Policy:  Patients undergoing a surgery or procedure may have two family members or support persons with them as long as the person is not COVID-19 positive or experiencing its symptoms.   Inpatient Visitation:    Visiting hours are 7 a.m. to 8 p.m. Up to four visitors are allowed at one time in a patient room, including children. The visitors may rotate out with other people during the day. One designated support person (adult) may remain overnight.

## 2021-09-30 MED ORDER — CELECOXIB 200 MG PO CAPS: 200.0000 mg | ORAL_CAPSULE | ORAL | Status: AC

## 2021-09-30 MED ORDER — ACETAMINOPHEN 500 MG PO TABS: 1000.0000 mg | ORAL_TABLET | ORAL | Status: AC

## 2021-09-30 MED ORDER — CHLORHEXIDINE GLUCONATE CLOTH 2 % EX PADS
6.0000 | MEDICATED_PAD | Freq: Once | CUTANEOUS | Status: AC
  Administered 2021-10-01: 6 via TOPICAL

## 2021-09-30 MED ORDER — CHLORHEXIDINE GLUCONATE 0.12 % MT SOLN: 15.0000 mL | Freq: Once | OROMUCOSAL | Status: AC

## 2021-09-30 MED ORDER — LACTATED RINGERS IV SOLN: INTRAVENOUS | Status: DC

## 2021-09-30 MED ORDER — CHLORHEXIDINE GLUCONATE CLOTH 2 % EX PADS: 6.0000 | MEDICATED_PAD | Freq: Once | CUTANEOUS | Status: DC

## 2021-09-30 MED ORDER — CEFAZOLIN SODIUM-DEXTROSE 2-4 GM/100ML-% IV SOLN
2.0000 g | INTRAVENOUS | Status: AC
  Administered 2021-10-01: 2 g via INTRAVENOUS

## 2021-09-30 MED ORDER — GABAPENTIN 300 MG PO CAPS: 300.0000 mg | ORAL_CAPSULE | ORAL | Status: AC

## 2021-09-30 MED ORDER — FAMOTIDINE 20 MG PO TABS: 20.0000 mg | ORAL_TABLET | Freq: Once | ORAL | Status: AC

## 2021-09-30 MED ORDER — ORAL CARE MOUTH RINSE: 15.0000 mL | Freq: Once | OROMUCOSAL | Status: AC

## 2021-10-01 ENCOUNTER — Other Ambulatory Visit: Payer: Self-pay

## 2021-10-01 ENCOUNTER — Observation Stay
Admission: RE | Admit: 2021-10-01 | Discharge: 2021-10-02 | Disposition: A | Discharge status: Home / Self Care | Payer: BC Managed Care – PPO | Attending: Surgery | Admitting: Surgery

## 2021-10-01 ENCOUNTER — Encounter: Payer: Self-pay | Admitting: Surgery

## 2021-10-01 ENCOUNTER — Encounter
Admission: RE | Disposition: A | Discharge status: Home / Self Care | Payer: Self-pay | Source: Home / Self Care | Attending: Surgery

## 2021-10-01 ENCOUNTER — Ambulatory Visit: Payer: BC Managed Care – PPO | Admitting: Anesthesiology

## 2021-10-01 DIAGNOSIS — Z8719 Personal history of other diseases of the digestive system: Secondary | ICD-10-CM

## 2021-10-01 DIAGNOSIS — K219 Gastro-esophageal reflux disease without esophagitis: Secondary | ICD-10-CM | POA: Insufficient documentation

## 2021-10-01 DIAGNOSIS — K449 Diaphragmatic hernia without obstruction or gangrene: Principal | ICD-10-CM | POA: Insufficient documentation

## 2021-10-01 HISTORY — PX: XI ROBOTIC ASSISTED PARAESOPHAGEAL HERNIA REPAIR: SHX6871

## 2021-10-01 LAB — CBC
HCT: 37.4 % (ref 36.0–46.0)
Hemoglobin: 12.4 g/dL (ref 12.0–15.0)
MCH: 28.6 pg (ref 26.0–34.0)
MCHC: 33.2 g/dL (ref 30.0–36.0)
MCV: 86.4 fL (ref 80.0–100.0)
Platelets: 208 10*3/uL (ref 150–400)
RBC: 4.33 MIL/uL (ref 3.87–5.11)
RDW: 13.2 % (ref 11.5–15.5)
WBC: 7.1 10*3/uL (ref 4.0–10.5)
nRBC: 0 % (ref 0.0–0.2)

## 2021-10-01 LAB — CREATININE, SERUM
Creatinine, Ser: 0.56 mg/dL (ref 0.44–1.00)
GFR, Estimated: >60 mL/min (ref >60)

## 2021-10-01 LAB — HIV ANTIBODY (ROUTINE TESTING W REFLEX): HIV Screen 4th Generation wRfx: NONREACTIVE

## 2021-10-01 SURGERY — REPAIR, HERNIA, PARAESOPHAGEAL, ROBOT-ASSISTED
Anesthesia: General

## 2021-10-01 MED ORDER — FENTANYL CITRATE (PF) 100 MCG/2ML IJ SOLN
INTRAMUSCULAR | Status: DC | PRN
  Administered 2021-10-01: 50 ug via INTRAVENOUS
  Administered 2021-10-01 (×2): 25 ug via INTRAVENOUS

## 2021-10-01 MED ORDER — ONDANSETRON HCL 4 MG/2ML IJ SOLN: 4.0000 mg | Freq: Once | INTRAMUSCULAR | Status: DC | PRN

## 2021-10-01 MED ORDER — EPHEDRINE 5 MG/ML INJ
INTRAVENOUS | Status: AC
  Filled 2021-10-01: qty 5

## 2021-10-01 MED ORDER — PHENYLEPHRINE 80 MCG/ML (10ML) SYRINGE FOR IV PUSH (FOR BLOOD PRESSURE SUPPORT)
PREFILLED_SYRINGE | INTRAVENOUS | Status: AC
  Filled 2021-10-01: qty 10

## 2021-10-01 MED ORDER — 0.9 % SODIUM CHLORIDE (POUR BTL) OPTIME
TOPICAL | Status: DC | PRN
  Administered 2021-10-01: 150 mL

## 2021-10-01 MED ORDER — VISTASEAL 10 ML SINGLE DOSE KIT
PACK | CUTANEOUS | Status: AC
  Filled 2021-10-01: qty 10

## 2021-10-01 MED ORDER — OXYCODONE HCL 5 MG PO TABS: 5.0000 mg | ORAL_TABLET | ORAL | Status: DC | PRN

## 2021-10-01 MED ORDER — PROPOFOL 10 MG/ML IV BOLUS
INTRAVENOUS | Status: AC
  Filled 2021-10-01: qty 20

## 2021-10-01 MED ORDER — MIDAZOLAM HCL 2 MG/2ML IJ SOLN
INTRAMUSCULAR | Status: AC
  Filled 2021-10-01: qty 2

## 2021-10-01 MED ORDER — FENTANYL CITRATE (PF) 100 MCG/2ML IJ SOLN: 25.0000 ug | INTRAMUSCULAR | Status: DC | PRN

## 2021-10-01 MED ORDER — CEFAZOLIN SODIUM-DEXTROSE 2-4 GM/100ML-% IV SOLN
INTRAVENOUS | Status: AC
  Filled 2021-10-01: qty 100

## 2021-10-01 MED ORDER — DEXAMETHASONE SODIUM PHOSPHATE 10 MG/ML IJ SOLN
INTRAMUSCULAR | Status: DC | PRN
  Administered 2021-10-01: 10 mg via INTRAVENOUS

## 2021-10-01 MED ORDER — PROPOFOL 1000 MG/100ML IV EMUL
INTRAVENOUS | Status: AC
  Filled 2021-10-01: qty 100

## 2021-10-01 MED ORDER — MORPHINE SULFATE (PF) 2 MG/ML IV SOLN: 2.0000 mg | INTRAVENOUS | Status: DC | PRN

## 2021-10-01 MED ORDER — CAFFEINE 200 MG PO TABS
200.0000 mg | ORAL_TABLET | Freq: Once | ORAL | Status: DC
  Filled 2021-10-01 (×2): qty 1

## 2021-10-01 MED ORDER — DEXAMETHASONE SODIUM PHOSPHATE 10 MG/ML IJ SOLN
INTRAMUSCULAR | Status: AC
  Filled 2021-10-01: qty 1

## 2021-10-01 MED ORDER — ACETAMINOPHEN 500 MG PO TABS
ORAL_TABLET | ORAL | Status: AC
  Administered 2021-10-01: 1000 mg via ORAL
  Filled 2021-10-01: qty 2

## 2021-10-01 MED ORDER — METHOCARBAMOL 500 MG PO TABS: 500.0000 mg | ORAL_TABLET | Freq: Three times a day (TID) | ORAL | Status: DC | PRN

## 2021-10-01 MED ORDER — FAMOTIDINE 20 MG PO TABS
ORAL_TABLET | ORAL | Status: AC
  Administered 2021-10-01: 20 mg via ORAL
  Filled 2021-10-01: qty 1

## 2021-10-01 MED ORDER — VISTASEAL 10 ML SINGLE DOSE KIT
PACK | CUTANEOUS | Status: DC | PRN
  Administered 2021-10-01: 10 mL via TOPICAL

## 2021-10-01 MED ORDER — ONDANSETRON HCL 4 MG/2ML IJ SOLN: 4.0000 mg | Freq: Four times a day (QID) | INTRAMUSCULAR | Status: DC | PRN

## 2021-10-01 MED ORDER — MIDAZOLAM HCL 2 MG/2ML IJ SOLN
INTRAMUSCULAR | Status: DC | PRN
  Administered 2021-10-01: 2 mg via INTRAVENOUS

## 2021-10-01 MED ORDER — SUMATRIPTAN SUCCINATE 50 MG PO TABS
25.0000 mg | ORAL_TABLET | ORAL | Status: DC | PRN
  Administered 2021-10-01: 25 mg via ORAL
  Filled 2021-10-01: qty 1

## 2021-10-01 MED ORDER — DIPHENHYDRAMINE HCL 12.5 MG/5ML PO ELIX: 12.5000 mg | ORAL_SOLUTION | Freq: Four times a day (QID) | ORAL | Status: DC | PRN

## 2021-10-01 MED ORDER — LIDOCAINE HCL (PF) 2 % IJ SOLN
INTRAMUSCULAR | Status: AC
  Filled 2021-10-01: qty 5

## 2021-10-01 MED ORDER — BUPIVACAINE LIPOSOME 1.3 % IJ SUSP
INTRAMUSCULAR | Status: AC
  Filled 2021-10-01: qty 20

## 2021-10-01 MED ORDER — PROCHLORPERAZINE EDISYLATE 10 MG/2ML IJ SOLN: 5.0000 mg | Freq: Four times a day (QID) | INTRAMUSCULAR | Status: DC | PRN

## 2021-10-01 MED ORDER — FENTANYL CITRATE (PF) 100 MCG/2ML IJ SOLN
INTRAMUSCULAR | Status: AC
  Filled 2021-10-01: qty 2

## 2021-10-01 MED ORDER — BUTALBITAL-APAP-CAFFEINE 50-325-40 MG PO TABS
2.0000 | ORAL_TABLET | Freq: Four times a day (QID) | ORAL | Status: DC | PRN
  Administered 2021-10-01 – 2021-10-02 (×2): 2 via ORAL
  Filled 2021-10-01 (×2): qty 2

## 2021-10-01 MED ORDER — PROPOFOL 10 MG/ML IV BOLUS
INTRAVENOUS | Status: DC | PRN
  Administered 2021-10-01: 90 mg via INTRAVENOUS

## 2021-10-01 MED ORDER — ROCURONIUM BROMIDE 10 MG/ML (PF) SYRINGE
PREFILLED_SYRINGE | INTRAVENOUS | Status: AC
  Filled 2021-10-01: qty 10

## 2021-10-01 MED ORDER — DEXMEDETOMIDINE HCL IN NACL 200 MCG/50ML IV SOLN
INTRAVENOUS | Status: DC | PRN
  Administered 2021-10-01: 4 ug via INTRAVENOUS
  Administered 2021-10-01: 8 ug via INTRAVENOUS
  Administered 2021-10-01 (×2): 4 ug via INTRAVENOUS

## 2021-10-01 MED ORDER — MELATONIN 5 MG PO TABS: 2.5000 mg | ORAL_TABLET | Freq: Every evening | ORAL | Status: DC | PRN

## 2021-10-01 MED ORDER — PROCHLORPERAZINE MALEATE 10 MG PO TABS: 10.0000 mg | ORAL_TABLET | Freq: Four times a day (QID) | ORAL | Status: DC | PRN

## 2021-10-01 MED ORDER — LIDOCAINE HCL (CARDIAC) PF 100 MG/5ML IV SOSY
PREFILLED_SYRINGE | INTRAVENOUS | Status: DC | PRN
  Administered 2021-10-01: 80 mg via INTRAVENOUS

## 2021-10-01 MED ORDER — ACETAMINOPHEN 500 MG PO TABS
1000.0000 mg | ORAL_TABLET | Freq: Four times a day (QID) | ORAL | Status: DC
  Administered 2021-10-01 (×2): 1000 mg via ORAL
  Filled 2021-10-01 (×2): qty 2

## 2021-10-01 MED ORDER — KETOROLAC TROMETHAMINE 30 MG/ML IJ SOLN
30.0000 mg | Freq: Four times a day (QID) | INTRAMUSCULAR | Status: DC
  Administered 2021-10-01 – 2021-10-02 (×4): 30 mg via INTRAVENOUS
  Filled 2021-10-01 (×4): qty 1

## 2021-10-01 MED ORDER — SODIUM CHLORIDE 0.9 % IV SOLN
INTRAVENOUS | Status: DC
Start: 2021-10-01 — End: 2021-10-02

## 2021-10-01 MED ORDER — EPHEDRINE SULFATE (PRESSORS) 50 MG/ML IJ SOLN
INTRAMUSCULAR | Status: DC | PRN
  Administered 2021-10-01: 10 mg via INTRAVENOUS

## 2021-10-01 MED ORDER — CELECOXIB 200 MG PO CAPS
ORAL_CAPSULE | ORAL | Status: AC
  Administered 2021-10-01: 200 mg via ORAL
  Filled 2021-10-01: qty 1

## 2021-10-01 MED ORDER — DIPHENHYDRAMINE HCL 50 MG/ML IJ SOLN: 12.5000 mg | Freq: Four times a day (QID) | INTRAMUSCULAR | Status: DC | PRN

## 2021-10-01 MED ORDER — BUPIVACAINE-EPINEPHRINE 0.25% -1:200000 IJ SOLN
INTRAMUSCULAR | Status: DC | PRN
  Administered 2021-10-01: 50 mL

## 2021-10-01 MED ORDER — ONDANSETRON 4 MG PO TBDP: 4.0000 mg | ORAL_TABLET | Freq: Four times a day (QID) | ORAL | Status: DC | PRN

## 2021-10-01 MED ORDER — CHLORHEXIDINE GLUCONATE 0.12 % MT SOLN
OROMUCOSAL | Status: AC
  Administered 2021-10-01: 15 mL via OROMUCOSAL
  Filled 2021-10-01: qty 15

## 2021-10-01 MED ORDER — ONDANSETRON HCL 4 MG/2ML IJ SOLN
INTRAMUSCULAR | Status: DC | PRN
  Administered 2021-10-01: 4 mg via INTRAVENOUS

## 2021-10-01 MED ORDER — SUGAMMADEX SODIUM 200 MG/2ML IV SOLN
INTRAVENOUS | Status: DC | PRN
  Administered 2021-10-01: 200 mg via INTRAVENOUS

## 2021-10-01 MED ORDER — ROCURONIUM BROMIDE 100 MG/10ML IV SOLN
INTRAVENOUS | Status: DC | PRN
  Administered 2021-10-01: 20 mg via INTRAVENOUS
  Administered 2021-10-01: 60 mg via INTRAVENOUS
  Administered 2021-10-01: 30 mg via INTRAVENOUS
  Administered 2021-10-01: 20 mg via INTRAVENOUS

## 2021-10-01 MED ORDER — BUPIVACAINE-EPINEPHRINE (PF) 0.25% -1:200000 IJ SOLN
INTRAMUSCULAR | Status: AC
  Filled 2021-10-01: qty 30

## 2021-10-01 MED ORDER — ENOXAPARIN SODIUM 40 MG/0.4ML IJ SOSY
40.0000 mg | PREFILLED_SYRINGE | INTRAMUSCULAR | Status: DC
  Administered 2021-10-02: 40 mg via SUBCUTANEOUS
  Filled 2021-10-01: qty 0.4

## 2021-10-01 MED ORDER — DEXMEDETOMIDINE HCL IN NACL 80 MCG/20ML IV SOLN
INTRAVENOUS | Status: AC
  Filled 2021-10-01: qty 20

## 2021-10-01 MED ORDER — ESCITALOPRAM OXALATE 10 MG PO TABS
10.0000 mg | ORAL_TABLET | Freq: Every day | ORAL | Status: DC
  Administered 2021-10-01: 10 mg via ORAL
  Filled 2021-10-01: qty 1

## 2021-10-01 MED ORDER — PHENYLEPHRINE HCL (PRESSORS) 10 MG/ML IV SOLN
INTRAVENOUS | Status: DC | PRN
  Administered 2021-10-01: 160 ug via INTRAVENOUS

## 2021-10-01 MED ORDER — GABAPENTIN 300 MG PO CAPS
ORAL_CAPSULE | ORAL | Status: AC
  Administered 2021-10-01: 300 mg via ORAL
  Filled 2021-10-01: qty 1

## 2021-10-01 MED ORDER — PROPOFOL 500 MG/50ML IV EMUL
INTRAVENOUS | Status: DC | PRN
  Administered 2021-10-01: 140 ug/kg/min via INTRAVENOUS

## 2021-10-01 MED ORDER — ONDANSETRON HCL 4 MG/2ML IJ SOLN
INTRAMUSCULAR | Status: AC
  Filled 2021-10-01: qty 2

## 2021-10-01 MED ORDER — ROCURONIUM BROMIDE 10 MG/ML (PF) SYRINGE
PREFILLED_SYRINGE | INTRAVENOUS | Status: AC
Start: 2021-10-01 — End: ?
  Filled 2021-10-01: qty 10

## 2021-10-01 MED ORDER — BUTALBITAL-APAP-CAFFEINE 50-325-40 MG PO TABS: 2.0000 | ORAL_TABLET | Freq: Four times a day (QID) | ORAL | Status: DC | PRN

## 2021-10-01 MED ORDER — METHOCARBAMOL 1000 MG/10ML IJ SOLN: 500.0000 mg | Freq: Three times a day (TID) | INTRAVENOUS | Status: DC | PRN

## 2021-10-01 SURGICAL SUPPLY — 60 items
ADH SKN CLS APL DERMABOND .7 (GAUZE/BANDAGES/DRESSINGS) ×1
APL LAPSCP 35 DL APL RGD (MISCELLANEOUS) ×1
APPLICATOR VISTASEAL 35 (MISCELLANEOUS) ×1 IMPLANT
BLADE SURG 15 STRL LF DISP TIS (BLADE) ×1 IMPLANT
BLADE SURG 15 STRL SS (BLADE) ×2
CANNULA REDUC XI 12-8 STAPL (CANNULA) ×2
CANNULA REDUCER 12-8 DVNC XI (CANNULA) ×1 IMPLANT
DERMABOND ADVANCED (GAUZE/BANDAGES/DRESSINGS) ×1
DERMABOND ADVANCED .7 DNX12 (GAUZE/BANDAGES/DRESSINGS) ×1 IMPLANT
DRAPE 3/4 80X56 (DRAPES) ×2 IMPLANT
DRAPE ARM DVNC X/XI (DISPOSABLE) ×4 IMPLANT
DRAPE COLUMN DVNC XI (DISPOSABLE) ×1 IMPLANT
DRAPE DA VINCI XI ARM (DISPOSABLE) ×8
DRAPE DA VINCI XI COLUMN (DISPOSABLE) ×2
ELECT CAUTERY BLADE 6.4 (BLADE) ×2 IMPLANT
ELECT REM PT RETURN 9FT ADLT (ELECTROSURGICAL) ×2
ELECTRODE REM PT RTRN 9FT ADLT (ELECTROSURGICAL) ×1 IMPLANT
GLOVE BIO SURGEON STRL SZ7 (GLOVE) ×6 IMPLANT
GOWN STRL REUS W/ TWL LRG LVL3 (GOWN DISPOSABLE) ×4 IMPLANT
GOWN STRL REUS W/TWL LRG LVL3 (GOWN DISPOSABLE) ×8
GRASPER LAPSCPC 5X45 DSP (INSTRUMENTS) ×2 IMPLANT
IRRIGATION STRYKERFLOW (MISCELLANEOUS) IMPLANT
IRRIGATOR STRYKERFLOW (MISCELLANEOUS) ×2
IV NS 1000ML (IV SOLUTION) ×2
IV NS 1000ML BAXH (IV SOLUTION) IMPLANT
KIT PINK PAD W/HEAD ARE REST (MISCELLANEOUS) ×2
KIT PINK PAD W/HEAD ARM REST (MISCELLANEOUS) ×1 IMPLANT
KIT TURNOVER CYSTO (KITS) ×2 IMPLANT
LABEL OR SOLS (LABEL) ×2 IMPLANT
MANIFOLD NEPTUNE II (INSTRUMENTS) ×2 IMPLANT
MESH BIO-A 7X10 SYN MAT (Mesh General) ×1 IMPLANT
NEEDLE HYPO 22GX1.5 SAFETY (NEEDLE) ×2 IMPLANT
OBTURATOR OPTICAL STANDARD 8MM (TROCAR) ×2
OBTURATOR OPTICAL STND 8 DVNC (TROCAR) ×1
OBTURATOR OPTICALSTD 8 DVNC (TROCAR) ×1 IMPLANT
PACK LAP CHOLECYSTECTOMY (MISCELLANEOUS) ×2 IMPLANT
PENCIL SMOKE EVACUATOR (MISCELLANEOUS) ×1 IMPLANT
SEAL CANN UNIV 5-8 DVNC XI (MISCELLANEOUS) ×3 IMPLANT
SEAL XI 5MM-8MM UNIVERSAL (MISCELLANEOUS) ×6
SEALER VESSEL DA VINCI XI (MISCELLANEOUS) ×2
SEALER VESSEL EXT DVNC XI (MISCELLANEOUS) ×1 IMPLANT
SOLUTION ELECTROLUBE (MISCELLANEOUS) ×2 IMPLANT
SPIKE FLUID TRANSFER (MISCELLANEOUS) ×2 IMPLANT
SPONGE T-LAP 18X18 ~~LOC~~+RFID (SPONGE) ×2 IMPLANT
STAPLER CANNULA SEAL DVNC XI (STAPLE) ×1 IMPLANT
STAPLER CANNULA SEAL XI (STAPLE) ×2
SUT MNCRL 4-0 (SUTURE) ×2
SUT MNCRL 4-0 27XMFL (SUTURE) ×1
SUT SILK 2 0 SH (SUTURE) ×6 IMPLANT
SUT VICRYL 0 AB UR-6 (SUTURE) ×4 IMPLANT
SUT VLOC 90 S/L VL9 GS22 (SUTURE) ×2 IMPLANT
SUTURE MNCRL 4-0 27XMF (SUTURE) ×1 IMPLANT
SYR 20ML LL LF (SYRINGE) ×2 IMPLANT
SYR 30ML LL (SYRINGE) ×2 IMPLANT
SYS BAG RETRIEVAL 10MM (BASKET) ×2
SYSTEM BAG RETRIEVAL 10MM (BASKET) IMPLANT
TRAY FOLEY SLVR 16FR LF STAT (SET/KITS/TRAYS/PACK) ×2 IMPLANT
TROCAR XCEL NON-BLD 5MMX100MML (ENDOMECHANICALS) ×2 IMPLANT
TUBING EVAC SMOKE HEATED PNEUM (TUBING) ×2 IMPLANT
WATER STERILE IRR 500ML POUR (IV SOLUTION) ×2 IMPLANT

## 2021-10-01 NOTE — Anesthesia Preprocedure Evaluation (Signed)
Anesthesia Evaluation  Patient identified by MRN, date of birth, ID band Patient awake    Reviewed: Allergy & Precautions, NPO status , Patient's Chart, lab work & pertinent test results  Airway Mallampati: III  TM Distance: >3 FB Neck ROM: Full    Dental  (+) Teeth Intact   Pulmonary neg pulmonary ROS,    Pulmonary exam normal breath sounds clear to auscultation       Cardiovascular Exercise Tolerance: Good negative cardio ROS Normal cardiovascular exam Rhythm:Regular     Neuro/Psych negative neurological ROS  negative psych ROS   GI/Hepatic negative GI ROS, Neg liver ROS, hiatal hernia, GERD  Medicated,  Endo/Other  negative endocrine ROS  Renal/GU negative Renal ROS  negative genitourinary   Musculoskeletal   Abdominal Normal abdominal exam  (+)   Peds negative pediatric ROS (+)  Hematology negative hematology ROS (+)   Anesthesia Other Findings Past Medical History: No date: GERD (gastroesophageal reflux disease) No date: Hemorrhoid No date: History of hiatal hernia No date: History of kidney stones No date: IBS (irritable bowel syndrome) No date: PONV (postoperative nausea and vomiting) No date: Rosacea  Past Surgical History: 03/02/1991: CESAREAN SECTION No date: COLONOSCOPY     Comment:  x2 08/10/2021: ESOPHAGOGASTRODUODENOSCOPY (EGD) WITH PROPOFOL; N/A     Comment:  Procedure: ESOPHAGOGASTRODUODENOSCOPY (EGD) WITH               PROPOFOL;  Surgeon: Toney Reil, MD;  Location:               [facility] ENDOSCOPY;  Service: Gastroenterology;  Laterality:               N/A; 03/01/2000: LAPAROSCOPIC HYSTERECTOMY No date: LASIK No date: tummy tuck  BMI    Body Mass Index: 25.13 kg/m      Reproductive/Obstetrics negative OB ROS                             Anesthesia Physical Anesthesia Plan  ASA: 2  Anesthesia Plan: General   Post-op Pain Management:     Induction: Intravenous  PONV Risk Score and Plan: 1 and Ondansetron and Dexamethasone  Airway Management Planned: Oral ETT  Additional Equipment:   Intra-op Plan:   Post-operative Plan: Extubation in OR  Informed Consent: I have reviewed the patients History and Physical, chart, labs and discussed the procedure including the risks, benefits and alternatives for the proposed anesthesia with the patient or authorized representative who has indicated his/her understanding and acceptance.     Dental Advisory Given  Plan Discussed with: CRNA and Surgeon  Anesthesia Plan Comments:         Anesthesia Quick Evaluation

## 2021-10-01 NOTE — Transfer of Care (Signed)
Immediate Anesthesia Transfer of Care Note  Patient: Sharon Branch  Procedure(s) Performed: XI ROBOTIC ASSISTED PARAESOPHAGEAL HERNIA REPAIR, RNFA to assist  Patient Location: PACU  Anesthesia Type:General  Level of Consciousness: drowsy  Airway & Oxygen Therapy: Patient Spontanous Breathing and Patient connected to face mask oxygen  Post-op Assessment: Report given to RN and Post -op Vital signs reviewed and stable  Post vital signs: Reviewed and stable  Last Vitals:  Vitals Value Taken Time  BP 101/53 10/01/21 1025  Temp 36.7 C 10/01/21 1025  Pulse 79 10/01/21 1031  Resp 19 10/01/21 1031  SpO2 100 % 10/01/21 1031  Vitals shown include unvalidated device data.  Last Pain:  Vitals:   10/01/21 1025  TempSrc:   PainSc: Asleep         Complications: No notable events documented.

## 2021-10-01 NOTE — Anesthesia Procedure Notes (Signed)
Procedure Name: Intubation Date/Time: 10/01/2021 7:38 AM  Performed by: Morene Crocker, CRNAPre-anesthesia Checklist: Patient identified, Patient being monitored, Timeout performed, Emergency Drugs available and Suction available Patient Re-evaluated:Patient Re-evaluated prior to induction Oxygen Delivery Method: Circle system utilized Preoxygenation: Pre-oxygenation with 100% oxygen Induction Type: IV induction Ventilation: Mask ventilation without difficulty Laryngoscope Size: 3 and McGraph Grade View: Grade I Tube type: Oral Tube size: 7.0 mm Number of attempts: 1 Airway Equipment and Method: Stylet Placement Confirmation: ETT inserted through vocal cords under direct vision, positive ETCO2 and breath sounds checked- equal and bilateral Secured at: 21 cm Tube secured with: Tape Dental Injury: Teeth and Oropharynx as per pre-operative assessment

## 2021-10-01 NOTE — Interval H&P Note (Signed)
History and Physical Interval Note:  10/01/2021 7:23 AM  Sharon Branch  has presented today for surgery, with the diagnosis of paraesophageal hernia.  The various methods of treatment have been discussed with the patient and family. After consideration of risks, benefits and other options for treatment, the patient has consented to  Procedure(s): XI ROBOTIC ASSISTED PARAESOPHAGEAL HERNIA REPAIR, RNFA to assist (N/A) as a surgical intervention.  The patient's history has been reviewed, patient examined, no change in status, stable for surgery.  I have reviewed the patient's chart and labs.  Questions were answered to the patient's satisfaction.     Royer Cristobal F Brei Pociask

## 2021-10-01 NOTE — Op Note (Signed)
Robotic assisted laparoscopic Nissen fundoplication w repair of  PARAESOPHAGEAL  hernia with Bio-A Mesh 10x 7 cm  Pre-operative Diagnosis: GERD, hiatal hernia  Post-operative Diagnosis: same  Procedure:  Robotic assisted laparoscopic Nissen fundoplication w repair of  PARAESOPHAGEAL  hernia with Bio-A Mesh 10x 7 cm  Surgeon: Sterling Big, MD FACS  Assistant:Daniel Suron RNFA. Required due to the complexity of the case the need for exposure and lack of first assist.  Anesthesia: Gen. with endotracheal tube  Findings: Type III paraesophageal hernia with 2/3 of the stomach within mediastinum Loose wrap 360 degree over 50 FR Bougie   Estimated Blood Loss: 15cc       Specimens: sac           Complications: none   Procedure Details  The patient was seen again in the Holding Room. The benefits, complications, treatment options, and expected outcomes were discussed with the patient. The risks of bleeding, infection, recurrence of symptoms, failure to resolve symptoms,  esophageal damage, Dysphagia, bowel injury, any of which could require further surgery were reviewed with the patient. The likelihood of improving the patient's symptoms with return to their baseline status is good.  The patient and/or family concurred with the proposed plan, giving informed consent.  The patient was taken to Operating Room, identified  and the procedure verified.  A Time Out was held and the above information confirmed.  Prior to the induction of general anesthesia, antibiotic prophylaxis was administered. VTE prophylaxis was in place. General endotracheal anesthesia was then administered and tolerated well. After the induction, the abdomen was prepped with Chloraprep and draped in the sterile fashion. The patient was positioned in the supine position.  Cut down technique was used to enter the abdominal cavity and a Hasson trochar was placed after two vicryl stitches were anchored to the fascia.  Pneumoperitoneum was then created with CO2 and tolerated well without any adverse changes in the patient's vital signs.  Three 8-mm ports were placed under direct vision. All skin incisions  were infiltrated with a local anesthetic agent before making the incision and placing the trocars. An additional 5 mm regular laparoscopic port was placed to assist with retraction and exposure.   The patient was positioned  in reverse Trendelenburg, robot was brought to the surgical field and docked in the standard fashion.  We made sure all the instrumentation was kept indirect view at all times and that there were no collision between the arms. I scrubbed out and went to the console.  I used a robotic arm to retract the liver, the vessel sealer on my right hand and a forced bipolar grasper on my left hand.  There is along the extra 5 mm port allow me ample exposure and the ability to perform meticulous dissection  We Started dividing the lesser omentum via the pars flaccida.  We Were able to dissect the lesser curvature of the stomach and  dissected the fundus free from the right and left crus.  We circumferentially dissected the GE junction.  The hernia sac was also completely reduced and we were able to bring the stomach into the intra-abdominal position.  Attention then was turned to the greater curvature where the short gastrics were divided with sealer device.  We were able to identify the left crus and again were able to make sure there was a good circumferential dissection and that the hernia sac was completely excised.  We did perform a good dissection within the mediastinum to allow a complete  reduction of the sac and a to completely allow an intra-abdominal Nissen fundoplication. The hernia sac was excised. I close the crus using two strip of BioA as pledgets and utilized a 2-0 vlock to approximate the crus .  Bio-A Mesh was placed on posterior aspect and secures with vistaseal.  We Asked anesthesia to  place a 50 French bougie and this went easily.  We also observe trajectory of the bougie. 360 degree Nissen fundoplication was created with multiple 2-0 silk sutures and we placed 3 stitches taking some of the esophagus within that bite.  The fundoplication measured approximately 3-1/2 cm and he was floppy. I was very happy with the way the fundoplication laid and the repair of the hernia.  Inspection of the  upper quadrant was performed. No bleeding, bile  Or esophageal injuries leaks, or bowel injuries were noted. Robotic instruments and robotic arms were undocked in the standard fashion. All the needles were removed under direct visualization.   I scrubbed back in.  Pneumoperitoneum was released.  The periumbilical port site was closed with interrumpted 0 Vicryl sutures. 4-0 subcuticular Monocryl was used to close the skin. Liposomal marcaine was injected to all the incisions sites.  Dermabond was  applied.  The patient was then extubated and brought to the recovery room in stable condition. Sponge, lap, and needle counts were correct at closure and at the conclusion of the case.               Sterling Big, MD, FACS

## 2021-10-01 NOTE — Anesthesia Postprocedure Evaluation (Signed)
Anesthesia Post Note  Patient: Sharon HOPPEL  Procedure(s) Performed: XI ROBOTIC ASSISTED PARAESOPHAGEAL HERNIA REPAIR, RNFA to assist  Patient location during evaluation: PACU Anesthesia Type: General Level of consciousness: awake and oriented Pain management: satisfactory to patient Vital Signs Assessment: post-procedure vital signs reviewed and stable Respiratory status: spontaneous breathing and nonlabored ventilation Cardiovascular status: stable Anesthetic complications: no   No notable events documented.   Last Vitals:  Vitals:   10/01/21 1030 10/01/21 1045  BP: (!) 102/59 117/68  Pulse: 79 76  Resp: 16 16  Temp:  (!) 36.2 C  SpO2: 99% 100%    Last Pain:  Vitals:   10/01/21 1045  TempSrc:   PainSc: Asleep                 VAN STAVEREN,Danese Dorsainvil

## 2021-10-02 ENCOUNTER — Encounter: Payer: Self-pay | Admitting: Surgery

## 2021-10-02 DIAGNOSIS — K449 Diaphragmatic hernia without obstruction or gangrene: Secondary | ICD-10-CM | POA: Diagnosis not present

## 2021-10-02 DIAGNOSIS — K219 Gastro-esophageal reflux disease without esophagitis: Secondary | ICD-10-CM | POA: Diagnosis not present

## 2021-10-02 LAB — CBC
HCT: 34.3 % — ABNORMAL LOW (ref 36.0–46.0)
Hemoglobin: 11.4 g/dL — ABNORMAL LOW (ref 12.0–15.0)
MCH: 28 pg (ref 26.0–34.0)
MCHC: 33.2 g/dL (ref 30.0–36.0)
MCV: 84.3 fL (ref 80.0–100.0)
Platelets: 224 10*3/uL (ref 150–400)
RBC: 4.07 MIL/uL (ref 3.87–5.11)
RDW: 13.4 % (ref 11.5–15.5)
WBC: 8.9 10*3/uL (ref 4.0–10.5)
nRBC: 0 % (ref 0.0–0.2)

## 2021-10-02 LAB — BASIC METABOLIC PANEL
Anion gap: 4 — ABNORMAL LOW (ref 5–15)
BUN: 11 mg/dL (ref 6–20)
CO2: 25 mmol/L (ref 22–32)
Calcium: 8.2 mg/dL — ABNORMAL LOW (ref 8.9–10.3)
Chloride: 110 mmol/L (ref 98–111)
Creatinine, Ser: 0.53 mg/dL (ref 0.44–1.00)
GFR, Estimated: >60 mL/min (ref >60)
Glucose, Bld: 87 mg/dL (ref 70–99)
Potassium: 3.9 mmol/L (ref 3.5–5.1)
Sodium: 139 mmol/L (ref 135–145)

## 2021-10-02 LAB — SURGICAL PATHOLOGY

## 2021-10-02 MED ORDER — IBUPROFEN 600 MG PO TABS: 600.0000 mg | ORAL_TABLET | Freq: Four times a day (QID) | ORAL | 0 refills | Status: DC | PRN

## 2021-10-02 MED ORDER — METHOCARBAMOL 500 MG PO TABS: 500.0000 mg | ORAL_TABLET | Freq: Three times a day (TID) | ORAL | 0 refills | Status: DC | PRN

## 2021-10-02 NOTE — Discharge Instructions (Signed)
In addition to included general post-operative instructions,  Diet: Recommend following Nissen diet recommendation/restrictions for a minimum of 4 weeks. You were given a hand out with details to this.   Activity: No heavy lifting >20 pounds (children, pets, laundry, garbage) or strenuous activity for 4 weeks, but light activity and walking are encouraged. Do not drive or drink alcohol if taking narcotic pain medications or having pain that might distract from driving.  Wound care: 2 days after surgery (08/05), you may shower/get incision wet with soapy water and pat dry (do not rub incisions), but no baths or submerging incision underwater until follow-up.   Medications: Resume all home medications. For mild to moderate pain: acetaminophen (Tylenol) or ibuprofen/naproxen (if no kidney disease). Combining Tylenol with alcohol can substantially increase your risk of causing liver disease. Narcotic pain medications, if prescribed, can be used for severe pain, though may cause nausea, constipation, and drowsiness. Do not combine Tylenol and Percocet (or similar) within a 6 hour period as Percocet (and similar) contain(s) Tylenol. If you do not need the narcotic pain medication, you do not need to fill the prescription.  Call office [phone] / [phone]) at any time if any questions, worsening pain, fevers/chills, bleeding, drainage from incision site, or other concerns.

## 2021-10-02 NOTE — Plan of Care (Signed)
  Problem: Education: Goal: Knowledge of General Education information will improve Description: Including pain rating scale, medication(s)/side effects and non-pharmacologic comfort measures Outcome: Progressing   Problem: Activity: Goal: Risk for activity intolerance will decrease Outcome: Progressing   Problem: Coping: Goal: Level of anxiety will decrease Outcome: Progressing   

## 2021-10-02 NOTE — Discharge Summary (Signed)
[facility] SURGICAL DISCHARGE SUMMARY  Patient ID: [name] MRN: [id number] DOB/AGE: [birth year] 61 y.o.  Admit date: 10/01/2021 Discharge date: 10/02/2021  Discharge Diagnoses Patient Active Problem List   Diagnosis Date Noted   S/P repair of paraesophageal hernia 10/01/2021   Abdominal pain, chronic, epigastric    Paraesophageal hernia     Consultants None  Procedures 10/01/2021:  Robotic assisted laparoscopic paraesophageal hernia repair with Nissen fundoplication   HPI: Sharon Branch is a 61 y.o. with history of paraesophageal hernia who presents to [facility] for scheduled repair  Hospital Course: Informed consent was obtained and documented, and patient underwent uneventful robotic assisted laparoscopic paraesophageal hernia repair with Nissen fundoplication (Dr Everlene Farrier, 08/03/202023).  Post-operatively, patient did very well. Advancement of patient's diet and ambulation were well-tolerated. The remainder of patient's hospital course was essentially unremarkable, and discharge planning was initiated accordingly with patient safely able to be discharged home with appropriate discharge instructions, pain control (patient declined narcotic pain medications), and outpatient follow-up after all of her questions were answered to her expressed satisfaction.   Discharge Condition: Good   Physical Examination:  Constitutional: Well appearing female, NAD Pulmonary: Normal effort, no respiratory distress Gastrointestinal: Soft, incisional soreness, non-distended, no rebound/guarding Skin: Laparoscopic incisions are CDI with dermabond, no erythema or drainage    Allergies as of 10/02/2021       Reactions   Codeine Nausea And Vomiting        Medication List     TAKE these medications    calcium carbonate 500 MG chewable tablet Commonly known as: TUMS - dosed in mg elemental calcium Chew 1 tablet by mouth as needed for indigestion or heartburn.    escitalopram 10 MG tablet Commonly known as: LEXAPRO Take 1 tablet (10 mg total) by mouth daily. What changed: when to take this   ibuprofen 600 MG tablet Commonly known as: ADVIL Take 1 tablet (600 mg total) by mouth every 6 (six) hours as needed.   methocarbamol 500 MG tablet Commonly known as: ROBAXIN Take 1 tablet (500 mg total) by mouth every 8 (eight) hours as needed for muscle spasms.          Follow-up Information     Pabon, [place] F, MD. Schedule an appointment as soon as possible for a visit in 2 week(s).   Specialty: General Surgery Why: S/P Laparoscopic Paraesophageal Hernia Repair Contact information: [address] [place] [address] [phone]                  Time spent on discharge management including discussion of hospital course, clinical condition, outpatient instructions, prescriptions, and follow up with the patient and members of the medical team: >30 minutes  -- Lynden Oxford , PA-C [facility]  10/02/2021, 8:55 AM [phone] M-F: 7am - 4pm

## 2021-10-02 NOTE — Progress Notes (Signed)
IV discontinue discharge paperwork explain and given to patient. and daughter. Pt states no concerns and questions at this time.

## 2021-10-14 ENCOUNTER — Encounter: Payer: Self-pay | Admitting: Surgery

## 2021-10-14 ENCOUNTER — Other Ambulatory Visit: Payer: Self-pay

## 2021-10-14 ENCOUNTER — Ambulatory Visit (INDEPENDENT_AMBULATORY_CARE_PROVIDER_SITE_OTHER): Payer: BC Managed Care – PPO | Admitting: Surgery

## 2021-10-14 VITALS — BP 118/73 | HR 70 | Temp 98.2°F | Ht 61.0 in | Wt 126.6 lb

## 2021-10-14 DIAGNOSIS — Z09 Encounter for follow-up examination after completed treatment for conditions other than malignant neoplasm: Secondary | ICD-10-CM

## 2021-10-14 DIAGNOSIS — K449 Diaphragmatic hernia without obstruction or gangrene: Secondary | ICD-10-CM

## 2021-10-14 NOTE — Progress Notes (Signed)
Sharon Branch is 2 weeks out after paraesophageal hernia repair with mesh.  She is doing very well.  Tolerating diet.  No dysphagia no fevers no chills.  Comes with daughter.  Ambulating normally  Physical exam: No acute distress awake alert Abdomen: Soft nontender incisions healing well without infection.   A/P doing very well after paraesophageal hernia repair.  Avoid carbonation and bread.  He slowly advance diet.  I will see her back in 3 months.  No evidence of complications

## 2021-10-14 NOTE — Patient Instructions (Signed)
Please call the office in a few weeks to schedule a 3 month follow up with dr.Pabon.

## 2021-11-23 ENCOUNTER — Telehealth: Payer: Self-pay

## 2021-11-23 NOTE — Telephone Encounter (Signed)
Patient is scheduled for 12/21/21 with LMD for annual exam.

## 2021-11-23 NOTE — Telephone Encounter (Signed)
Refill request recv'd from [facility] on [address] for escitalopram 10mg  tabs.  Pt needs annual first.

## 2021-11-24 ENCOUNTER — Other Ambulatory Visit: Payer: Self-pay | Admitting: Licensed Practical Nurse

## 2021-11-24 MED ORDER — ESCITALOPRAM OXALATE 10 MG PO TABS
10.0000 mg | ORAL_TABLET | Freq: Every evening | ORAL | 3 refills | Status: DC
Start: 2021-11-24 — End: 2022-12-08

## 2021-11-24 NOTE — Progress Notes (Signed)
Pt requesting refill for escitalopram, has annual scheduled with this CNM, medication reordered Roberto Scales, [place], [facility]  11/24/21  10:13 AM

## 2021-11-27 ENCOUNTER — Telehealth: Payer: Self-pay | Admitting: Gastroenterology

## 2021-11-27 NOTE — Telephone Encounter (Signed)
Please schedule direct procedure esophageal manometry at hospital next available doesn't need office visit.

## 2021-11-27 NOTE — Telephone Encounter (Signed)
HI Beth,  We received a referral from [facility] for a esophageal manometry for pre op evaluation for hiatal hernia.  Please advise scheduling?

## 2021-12-03 ENCOUNTER — Other Ambulatory Visit: Payer: Self-pay

## 2021-12-03 DIAGNOSIS — K449 Diaphragmatic hernia without obstruction or gangrene: Secondary | ICD-10-CM

## 2021-12-03 NOTE — Telephone Encounter (Signed)
Patient is scheduled for the first available opening 04/07/22 at 10:30 am. Message left for the patient. Information of the appointment mailed to the patient.

## 2021-12-16 ENCOUNTER — Telehealth: Payer: Self-pay

## 2021-12-16 NOTE — Telephone Encounter (Signed)
Patient verbalized understanding and states she will call them to cancel it

## 2021-12-16 NOTE — Telephone Encounter (Signed)
Patient is calling because she had her henia surgery on 10/01/2021 she states she is schedule for esophageal manometry on 04/07/22 and she states that she does not know if she needs this done since she already had the hernia surgery. Patient is wanting to know if she needs this test or can she cancel it because she had already met her deductible for the year and they can not do the test till February. Please advised

## 2021-12-17 ENCOUNTER — Telehealth: Payer: Self-pay | Admitting: Gastroenterology

## 2021-12-17 NOTE — Telephone Encounter (Signed)
Canceled per patient request. She discussed this with the referring office.

## 2021-12-17 NOTE — Telephone Encounter (Signed)
Inbound call from patient wanting to cancel hospital procedure for 04/07/2022. Please advise.

## 2021-12-21 ENCOUNTER — Encounter: Payer: Self-pay | Admitting: Licensed Practical Nurse

## 2021-12-21 ENCOUNTER — Other Ambulatory Visit (HOSPITAL_COMMUNITY)
Admission: RE | Admit: 2021-12-21 | Discharge: 2021-12-21 | Disposition: A | Discharge status: Home / Self Care | Payer: BC Managed Care – PPO | Source: Ambulatory Visit | Attending: Licensed Practical Nurse | Admitting: Licensed Practical Nurse

## 2021-12-21 ENCOUNTER — Ambulatory Visit (INDEPENDENT_AMBULATORY_CARE_PROVIDER_SITE_OTHER): Payer: BC Managed Care – PPO | Admitting: Licensed Practical Nurse

## 2021-12-21 VITALS — BP 112/69 | HR 61 | Resp 15 | Ht 62.0 in | Wt 130.0 lb

## 2021-12-21 DIAGNOSIS — Z01419 Encounter for gynecological examination (general) (routine) without abnormal findings: Secondary | ICD-10-CM | POA: Diagnosis not present

## 2021-12-21 DIAGNOSIS — Z1272 Encounter for screening for malignant neoplasm of vagina: Secondary | ICD-10-CM

## 2021-12-21 NOTE — Progress Notes (Signed)
Gynecology Annual Exam  PCP: Jerrol Banana., MD  Chief Complaint:  Chief Complaint  Patient presents with   Annual Exam    History of Present Illness:Patient is a 61 y.o. G2P2 presents for annual exam. The patient has no complaints today.   LMP: No LMP recorded. Patient has had a hysterectomy.  The patient is not sexually active. The patient does not perform self breast exams.  There is no notable family history of breast or ovarian cancer in her family.  The patient wears seatbelts: yes.   The patient has regular exercise:  is active her yard and likes to go for walks .   Currently retired, is active outdoors Lives with her daughter PCP scheduled in [place] Dentist  goes every 6 months Eye exam needs  Does not supplement Calcium or Vit D   The patient denies current symptoms of depression.   Currently on Lexapro for anxiety, states "I have not had any anxiety so it mush be working"  Review of Systems: Review of Systems  Constitutional: Negative.   HENT: Negative.    Eyes: Negative.   Respiratory: Negative.    Cardiovascular: Negative.   Gastrointestinal: Negative.   Genitourinary: Negative.   Musculoskeletal: Negative.   Skin: Negative.   Neurological: Negative.   Endo/Heme/Allergies: Negative.   Psychiatric/Behavioral: Negative.      Past Medical History:  Patient Active Problem List   Diagnosis Date Noted   S/P repair of paraesophageal hernia 10/01/2021   Abdominal pain, chronic, epigastric    Paraesophageal hernia    Rosacea 01/14/2020    Past Surgical History:  Past Surgical History:  Procedure Laterality Date   CESAREAN SECTION  03/02/1991   COLONOSCOPY     x2   ESOPHAGOGASTRODUODENOSCOPY (EGD) WITH PROPOFOL N/A 08/10/2021   Procedure: ESOPHAGOGASTRODUODENOSCOPY (EGD) WITH PROPOFOL;  Surgeon: Lin Landsman, MD;  Location: [facility];  Service: Gastroenterology;  Laterality: N/A;   LAPAROSCOPIC HYSTERECTOMY  03/01/2000   LASIK      tummy tuck     XI ROBOTIC ASSISTED PARAESOPHAGEAL HERNIA REPAIR N/A 10/01/2021   Procedure: XI ROBOTIC ASSISTED PARAESOPHAGEAL HERNIA REPAIR, RNFA to assist;  Surgeon: Jules Husbands, MD;  Location: [facility] ORS;  Service: General;  Laterality: N/A;    Gynecologic History:  No LMP recorded. Patient has had a hysterectomy. Last Pap: Results were: 2019 no abnormalities  Last mammogram: 2022 Results were: BI-RAD I  Obstetric History: G2P2  Family History:  Family History  Problem Relation Age of Onset   Breast cancer Mother     Social History:  Social History   Socioeconomic History   Marital status: Legally Separated    Spouse name: Not on file   Number of children: Not on file   Years of education: Not on file   Highest education level: Not on file  Occupational History   Not on file  Tobacco Use   Smoking status: Never   Smokeless tobacco: Never  Vaping Use   Vaping Use: Never used  Substance and Sexual Activity   Alcohol use: No   Drug use: No   Sexual activity: Not Currently  Other Topics Concern   Not on file  Social History Narrative   Not on file   Social Determinants of Health   Financial Resource Strain: Not on file  Food Insecurity: Not on file  Transportation Needs: Not on file  Physical Activity: Not on file  Stress: Not on file  Social Connections: Not on  file  Intimate Partner Violence: Not on file    Allergies:  Allergies  Allergen Reactions   Codeine Nausea And Vomiting    Medications: Prior to Admission medications   Medication Sig Start Date End Date Taking? Authorizing Provider  escitalopram (LEXAPRO) 10 MG tablet Take 1 tablet (10 mg total) by mouth at bedtime. 11/24/21   Malayjah Otoole, Courtney Heys, CNM  ibuprofen (ADVIL) 600 MG tablet Take 1 tablet (600 mg total) by mouth every 6 (six) hours as needed. 10/02/21   Donovan Kail, PA-C  methocarbamol (ROBAXIN) 500 MG tablet Take 1 tablet (500 mg total) by mouth every 8 (eight) hours as needed for  muscle spasms. 10/02/21   Donovan Kail, PA-C    Physical Exam Vitals: Blood pressure 112/69, pulse 61, resp. rate 15, height 5\' 2"  (1.575 m), weight 130 lb (59 kg).  General: NAD HEENT: normocephalic, anicteric Thyroid: no enlargement, no palpable nodules Pulmonary: No increased work of breathing, CTAB Cardiovascular: RRR, distal pulses 2+ Breast: Breast symmetrical, no tenderness, no palpable nodules or masses, no skin or nipple retraction present, no nipple discharge.  No axillary or supraclavicular lymphadenopathy. Abdomen: NABS, soft, non-tender, non-distended.  Umbilicus without lesions.  No hepatomegaly, splenomegaly or masses palpable. No evidence of hernia Laparoscopic incisions well healed   Genitourinary:  External: Normal external female genitalia.  Normal urethral meatus, normal Bartholin's and Skene's glands.    Vagina: Normal vaginal mucosa, no evidence of prolapse.    Cervix: surgically absent  Uterus: surgically absent   Adnexa: pt reports her ovaries were removed, bimanual exam not performed, no masses palpate during abdominal exam   Rectal: deferred  Lymphatic: no evidence of inguinal lymphadenopathy Extremities: no edema, erythema, or tenderness Neurologic: Grossly intact Psychiatric: mood appropriate, affect    Assessment: 61 y.o. G2P2 routine annual exam  Plan: Problem List Items Addressed This Visit   None Visit Diagnoses     Well woman exam    -  Primary   Screening for vaginal cancer           1) Mammogram - recommend yearly screening mammogram.  Mammogram Was ordered today  2) STI screening  wasoffered and declined  3) ASCCP guidelines and rational discussed.  Patient opts for every 5 years screening interval  4) Osteoporosis  - per USPTF routine screening DEXA at age 62   Consider FDA-approved medical therapies in postmenopausal women and men aged 32 years and older, based on the following: a) A hip or vertebral (clinical or morphometric)  fracture b) T-score ? -2.5 at the femoral neck or spine after appropriate evaluation to exclude secondary causes C) Low bone mass (T-score between -1.0 and -2.5 at the femoral neck or spine) and a 10-year probability of a hip fracture ? 3% or a 10-year probability of a major osteoporosis-related fracture ? 20% based on the [place]-adapted WHO algorithm   5) Routine healthcare maintenance including cholesterol, diabetes screening discussed managed by PCP  6) Colonoscopy UP to date .  Screening recommended starting at age 67 for average risk individuals, age 41 for individuals deemed at increased risk (including African Americans) and recommended to continue until age 35.  For patient age 22-85 individualized approach is recommended.  Gold standard screening is via colonoscopy, Cologuard screening is an acceptable alternative for patient unwilling or unable to undergo colonoscopy.  "Colorectal cancer screening for average?risk adults: 2018 guideline update from the American Cancer Society"[place]: A Cancer Journal for Clinicians: Jul 28, 2016   7) No follow-ups  on file.   Carie Caddy, CNM   [facility] 12/21/2021, 1:57 PM

## 2021-12-22 DIAGNOSIS — Z1272 Encounter for screening for malignant neoplasm of vagina: Secondary | ICD-10-CM | POA: Diagnosis not present

## 2021-12-23 LAB — CYTOLOGY - PAP: Diagnosis: NEGATIVE

## 2022-01-05 NOTE — Progress Notes (Unsigned)
I,Joseline E Rosas,acting as a scribe for [employer], MD.,have documented all relevant documentation on the behalf of Ronnald Ramp, MD,as directed by  Ronnald Ramp, MD while in the presence of Ronnald Ramp, MD.   Complete physical exam   Patient: Sharon Branch   DOB: 1960-04-14   61 y.o. Female  MRN: [id number] Visit Date: 01/07/2022  Today's healthcare provider: Ronnald Ramp, MD   Chief Complaint  Patient presents with   Annual Exam   Subjective    Sharon Branch is a 61 y.o. female who presents today for a complete physical exam.  She reports consuming a general diet. Home exercise routine includes walks a mile daily.  She generally feels well.  She reports sleeping well.  She does have additional problems to discuss today.    Health Updates  Reports she had a hernia surgery 12 weeks ago. 10/01/2021. She was hypocalcemic at that time, recommended recheck of levels today   Health Maintenance Influenza Vaccine: completed 11/19/21  TDAP: recommended booster  Shingles Vax: recommended   Colonoscopy: Dr. [facility] 10/10/2013-repeat in 10 years Recommended Hepatitis C screening today  Given hx of hypocalcemia, recommended 800mg  of vitamin D and 1200mg  calcium daily for bone health before age 60      Rosacea  Patient reports hx of this problem since having her children  She states she was previously on doxycycline for this problem and her rosacea was well controlled She has been evaluated recently and started on the medication for a limited supply and is requesting a refill today     Past Medical History:  Diagnosis Date   GERD (gastroesophageal reflux disease)    Hemorrhoid    History of hiatal hernia    History of kidney stones    IBS (irritable bowel syndrome)    PONV (postoperative nausea and vomiting)    Rosacea    Past Surgical History:  Procedure Laterality Date   CESAREAN  SECTION  03/02/1991   COLONOSCOPY     x2   ESOPHAGOGASTRODUODENOSCOPY (EGD) WITH PROPOFOL N/A 08/10/2021   Procedure: ESOPHAGOGASTRODUODENOSCOPY (EGD) WITH PROPOFOL;  Surgeon: 04/30/1991, MD;  Location: [facility];  Service: Gastroenterology;  Laterality: N/A;   LAPAROSCOPIC HYSTERECTOMY  03/01/2000   LASIK     tummy tuck     XI ROBOTIC ASSISTED PARAESOPHAGEAL HERNIA REPAIR N/A 10/01/2021   Procedure: XI ROBOTIC ASSISTED PARAESOPHAGEAL HERNIA REPAIR, RNFA to assist;  Surgeon: 04/29/2000, MD;  Location: [facility] ORS;  Service: General;  Laterality: N/A;   Social History   Socioeconomic History   Marital status: Legally Separated    Spouse name: Not on file   Number of children: Not on file   Years of education: Not on file   Highest education level: Not on file  Occupational History   Not on file  Tobacco Use   Smoking status: Never   Smokeless tobacco: Never  Vaping Use   Vaping Use: Never used  Substance and Sexual Activity   Alcohol use: No   Drug use: No   Sexual activity: Not Currently  Other Topics Concern   Not on file  Social History Narrative   Not on file   Social Determinants of Health   Financial Resource Strain: Not on file  Food Insecurity: Not on file  Transportation Needs: Not on file  Physical Activity: Not on file  Stress: Not on file  Social Connections: Not on file  Intimate Partner Violence: Not on file  Family Status  Relation Name Status   Mother  Deceased   Father  Deceased   Family History  Problem Relation Age of Onset   Breast cancer Mother    Allergies  Allergen Reactions   Codeine Nausea And Vomiting    Patient Care Team: Ronnald Ramp, MD as PCP - General (Family Medicine) Arvil Chaco, Belia Heman, MD (Unknown Physician Specialty) Earline Mayotte, MD (General Surgery)   Medications: Outpatient Medications Prior to Visit  Medication Sig   escitalopram (LEXAPRO) 10 MG tablet Take 1 tablet (10 mg total)  by mouth at bedtime.   [DISCONTINUED] doxycycline (VIBRAMYCIN) 100 MG capsule Take 100 mg by mouth 2 (two) times daily.   No facility-administered medications prior to visit.    Review of Systems  All other systems reviewed and are negative.     Objective    BP 94/62 (BP Location: Left Arm, Patient Position: Sitting, Cuff Size: Normal)   Pulse 83   Temp 98.5 F (36.9 C) (Oral)   Resp 16   Ht 5\' 1"  (1.549 m)   Wt 130 lb (59 kg)   BMI 24.56 kg/m     Physical Exam Vitals reviewed.  Constitutional:      General: She is not in acute distress.    Appearance: Normal appearance. She is not ill-appearing, toxic-appearing or diaphoretic.  HENT:     Head: Normocephalic and atraumatic.     Right Ear: Tympanic membrane and external ear normal. There is no impacted cerumen.     Left Ear: Tympanic membrane and external ear normal. There is no impacted cerumen.     Nose: Nose normal.     Mouth/Throat:     Pharynx: Oropharynx is clear.  Eyes:     General: No scleral icterus.    Extraocular Movements: Extraocular movements intact.     Conjunctiva/sclera: Conjunctivae normal.     Pupils: Pupils are equal, round, and reactive to light.  Cardiovascular:     Rate and Rhythm: Normal rate and regular rhythm.     Pulses: Normal pulses.     Heart sounds: Normal heart sounds. No murmur heard.    No friction rub. No gallop.  Pulmonary:     Effort: Pulmonary effort is normal. No respiratory distress.     Breath sounds: Normal breath sounds. No wheezing, rhonchi or rales.  Abdominal:     General: Bowel sounds are normal. There is no distension.     Palpations: Abdomen is soft. There is no mass.     Tenderness: There is no abdominal tenderness. There is no guarding.  Musculoskeletal:        General: No deformity.     Cervical back: Normal range of motion and neck supple. No rigidity.     Right lower leg: No edema.     Left lower leg: No edema.  Lymphadenopathy:     Cervical: No cervical  adenopathy.  Skin:    General: Skin is warm.     Capillary Refill: Capillary refill takes less than 2 seconds.     Findings: No erythema or rash.     Comments: Mild erythema of malar region Patient wearing make up today She does not appear flushed or to have facial rash   Neurological:     General: No focal deficit present.     Mental Status: She is alert and oriented to person, place, and time.     Motor: No weakness.     Gait: Gait normal.  Psychiatric:  Mood and Affect: Mood normal.        Behavior: Behavior normal.       Last depression screening scores    01/07/2022    9:16 AM 05/06/2021    3:24 PM 09/17/2020    2:28 PM  PHQ 2/9 Scores  PHQ - 2 Score 0 0 0  PHQ- 9 Score  1 1   Last fall risk screening    01/07/2022    9:16 AM  [place] in the past year? 0  Number falls in past yr: 0  Injury with Fall? 0  Risk for fall due to : No Fall Risks   Last Audit-C alcohol use screening    01/07/2022    9:17 AM  Alcohol Use Disorder Test (AUDIT)  1. How often do you have a drink containing alcohol? 0  2. How many drinks containing alcohol do you have on a typical day when you are drinking? 0  3. How often do you have six or more drinks on one occasion? 0  AUDIT-C Score 0   A score of 3 or more in women, and 4 or more in men indicates increased risk for alcohol abuse, EXCEPT if all of the points are from question 1   Results for orders placed or performed in visit on 01/07/22  [facility] COLONOSCOPY  Result Value Ref Range   [facility] Colonoscopy See Report (in chart) See Report (in chart), Patient Reported    Assessment & Plan    Routine Health Maintenance and Physical Exam  Exercise Activities and Dietary recommendations  Goals   None     Immunization History  Administered Date(s) Administered   Influenza,inj,Quad PF,6+ Mos 12/13/2017, 11/12/2020    Health Maintenance  Topic Date Due   Hepatitis C Screening  Never done   TETANUS/TDAP  Never done    Zoster Vaccines- Shingrix (1 of 2) Never done   MAMMOGRAM  09/26/2022   COLONOSCOPY (Pts 45-15yrs Insurance coverage will need to be confirmed)  10/11/2023   PAP SMEAR-Modifier  12/21/2024   INFLUENZA VACCINE  Completed   HIV Screening  Completed   HPV VACCINES  Aged Out    Discussed health benefits of physical activity, and encouraged her to engage in regular exercise appropriate for her age and condition.  Problem List Items Addressed This Visit       Musculoskeletal and Integument   Rosacea    Will restart patient's doxycycline 100mg  twice daily  Refills provided          Other   Encounter for annual physical examination excluding gynecological examination in a patient older than 17 years - Primary    Hep C screening  UTD on influenza vaccine  Will obtain records for Shingrix vaccination, patient reports completed within last 2 years at pharmacy  Colonoscopy due in 2025       Encounter for hepatitis C screening test for low risk patient    Agreeable to Hep c screening today       Relevant Orders   Hepatitis C Antibody   Hypocalcemia    8.2 in August of 2023  Will recheck levels today  Recommended supplemental calcium and vitamin D for bone health       Relevant Orders   Comprehensive metabolic panel     Return in about 1 year (around 01/08/2023) for CPE.    I, Eulis Foster, MD, have reviewed all documentation for this visit. The documentation on 01/07/22 for the exam, diagnosis,  procedures, and orders are all accurate and complete.  Portions of this information were initially documented by the CMA and reviewed by me for thoroughness and accuracy.      Ronnald Ramp, MD  [facility] [phone] (phone) [phone] (fax)  [facility]

## 2022-01-07 ENCOUNTER — Encounter: Payer: Self-pay | Admitting: Family Medicine

## 2022-01-07 ENCOUNTER — Ambulatory Visit (INDEPENDENT_AMBULATORY_CARE_PROVIDER_SITE_OTHER): Payer: BC Managed Care – PPO | Admitting: Family Medicine

## 2022-01-07 VITALS — BP 94/62 | HR 83 | Temp 98.5°F | Resp 16 | Ht 61.0 in | Wt 130.0 lb

## 2022-01-07 DIAGNOSIS — L719 Rosacea, unspecified: Secondary | ICD-10-CM

## 2022-01-07 DIAGNOSIS — Z0000 Encounter for general adult medical examination without abnormal findings: Secondary | ICD-10-CM | POA: Diagnosis not present

## 2022-01-07 DIAGNOSIS — Z1159 Encounter for screening for other viral diseases: Secondary | ICD-10-CM | POA: Insufficient documentation

## 2022-01-07 MED ORDER — DOXYCYCLINE HYCLATE 100 MG PO CAPS
100.0000 mg | ORAL_CAPSULE | Freq: Two times a day (BID) | ORAL | 2 refills | Status: DC
Start: 2022-01-07 — End: 2022-09-30

## 2022-01-07 NOTE — Assessment & Plan Note (Signed)
8.2 in August of 2023  Will recheck levels today  Recommended supplemental calcium and vitamin D for bone health

## 2022-01-07 NOTE — Patient Instructions (Signed)
Health Maintenance for Postmenopausal Women Menopause is a normal process in which your ability to get pregnant comes to an end. This process happens slowly over many months or years, usually between the ages of 48 and 55. Menopause is complete when you have missed your menstrual period for 12 months. It is important to talk with your health care provider about some of the most common conditions that affect women after menopause (postmenopausal women). These include heart disease, cancer, and bone loss (osteoporosis). Adopting a healthy lifestyle and getting preventive care can help to promote your health and wellness. The actions you take can also lower your chances of developing some of these common conditions. What are the signs and symptoms of menopause? During menopause, you may have the following symptoms: Hot flashes. These can be moderate or severe. Night sweats. Decrease in sex drive. Mood swings. Headaches. Tiredness (fatigue). Irritability. Memory problems. Problems falling asleep or staying asleep. Talk with your health care provider about treatment options for your symptoms. Do I need hormone replacement therapy? Hormone replacement therapy is effective in treating symptoms that are caused by menopause, such as hot flashes and night sweats. Hormone replacement carries certain risks, especially as you become older. If you are thinking about using estrogen or estrogen with progestin, discuss the benefits and risks with your health care provider. How can I reduce my risk for heart disease and stroke? The risk of heart disease, heart attack, and stroke increases as you age. One of the causes may be a change in the body's hormones during menopause. This can affect how your body uses dietary fats, triglycerides, and cholesterol. Heart attack and stroke are medical emergencies. There are many things that you can do to help prevent heart disease and stroke. Watch your blood pressure High  blood pressure causes heart disease and increases the risk of stroke. This is more likely to develop in people who have high blood pressure readings or are overweight. Have your blood pressure checked: Every 3-5 years if you are 18-39 years of age. Every year if you are 40 years old or older. Eat a healthy diet  Eat a diet that includes plenty of vegetables, fruits, low-fat dairy products, and lean protein. Do not eat a lot of foods that are high in solid fats, added sugars, or sodium. Get regular exercise Get regular exercise. This is one of the most important things you can do for your health. Most adults should: Try to exercise for at least 150 minutes each week. The exercise should increase your heart rate and make you sweat (moderate-intensity exercise). Try to do strengthening exercises at least twice each week. Do these in addition to the moderate-intensity exercise. Spend less time sitting. Even light physical activity can be beneficial. Other tips Work with your health care provider to achieve or maintain a healthy weight. Do not use any products that contain nicotine or tobacco. These products include cigarettes, chewing tobacco, and vaping devices, such as e-cigarettes. If you need help quitting, ask your health care provider. Know your numbers. Ask your health care provider to check your cholesterol and your blood sugar (glucose). Continue to have your blood tested as directed by your health care provider. Do I need screening for cancer? Depending on your health history and family history, you may need to have cancer screenings at different stages of your life. This may include screening for: Breast cancer. Cervical cancer. Lung cancer. Colorectal cancer. What is my risk for osteoporosis? After menopause, you may be   at increased risk for osteoporosis. Osteoporosis is a condition in which bone destruction happens more quickly than new bone creation. To help prevent osteoporosis or  the bone fractures that can happen because of osteoporosis, you may take the following actions: If you are 19-50 years old, get at least 1,000 mg of calcium and at least 600 international units (IU) of vitamin D per day. If you are older than age 50 but younger than age 70, get at least 1,200 mg of calcium and at least 600 international units (IU) of vitamin D per day. If you are older than age 70, get at least 1,200 mg of calcium and at least 800 international units (IU) of vitamin D per day. Smoking and drinking excessive alcohol increase the risk of osteoporosis. Eat foods that are rich in calcium and vitamin D, and do weight-bearing exercises several times each week as directed by your health care provider. How does menopause affect my mental health? Depression may occur at any age, but it is more common as you become older. Common symptoms of depression include: Feeling depressed. Changes in sleep patterns. Changes in appetite or eating patterns. Feeling an overall lack of motivation or enjoyment of activities that you previously enjoyed. Frequent crying spells. Talk with your health care provider if you think that you are experiencing any of these symptoms. General instructions See your health care provider for regular wellness exams and vaccines. This may include: Scheduling regular health, dental, and eye exams. Getting and maintaining your vaccines. These include: Influenza vaccine. Get this vaccine each year before the flu season begins. Pneumonia vaccine. Shingles vaccine. Tetanus, diphtheria, and pertussis (Tdap) booster vaccine. Your health care provider may also recommend other immunizations. Tell your health care provider if you have ever been abused or do not feel safe at home. Summary Menopause is a normal process in which your ability to get pregnant comes to an end. This condition causes hot flashes, night sweats, decreased interest in sex, mood swings, headaches, or lack  of sleep. Treatment for this condition may include hormone replacement therapy. Take actions to keep yourself healthy, including exercising regularly, eating a healthy diet, watching your weight, and checking your blood pressure and blood sugar levels. Get screened for cancer and depression. Make sure that you are up to date with all your vaccines. This information is not intended to replace advice given to you by your health care provider. Make sure you discuss any questions you have with your health care provider. Document Revised: 07/07/2020 Document Reviewed: 07/07/2020 Elsevier Patient Education  2023 [employer] Inc.  

## 2022-01-07 NOTE — Assessment & Plan Note (Signed)
Will restart patient's doxycycline 100mg  twice daily  Refills provided

## 2022-01-07 NOTE — Assessment & Plan Note (Signed)
Agreeable to Hep c screening today

## 2022-01-07 NOTE — Assessment & Plan Note (Signed)
Hep C screening  UTD on influenza vaccine  Will obtain records for Shingrix vaccination, patient reports completed within last 2 years at pharmacy  Colonoscopy due in 2025

## 2022-01-08 LAB — COMPREHENSIVE METABOLIC PANEL
ALT: 10 IU/L (ref 0–32)
AST: 19 IU/L (ref 0–40)
Albumin/Globulin Ratio: 2.2 (ref 1.2–2.2)
Albumin: 4.3 g/dL (ref 3.8–4.9)
Alkaline Phosphatase: 199 IU/L — ABNORMAL HIGH (ref 44–121)
BUN/Creatinine Ratio: 33 — ABNORMAL HIGH (ref 12–28)
BUN: 19 mg/dL (ref 8–27)
Bilirubin Total: 0.3 mg/dL (ref 0.0–1.2)
CO2: 20 mmol/L (ref 20–29)
Calcium: 9 mg/dL (ref 8.7–10.3)
Chloride: 106 mmol/L (ref 96–106)
Creatinine, Ser: 0.57 mg/dL (ref 0.57–1.00)
Globulin, Total: 2 g/dL (ref 1.5–4.5)
Glucose: 60 mg/dL — ABNORMAL LOW (ref 70–99)
Potassium: 3.9 mmol/L (ref 3.5–5.2)
Sodium: 142 mmol/L (ref 134–144)
Total Protein: 6.3 g/dL (ref 6.0–8.5)
eGFR: 104 mL/min/{1.73_m2} (ref >59)

## 2022-01-08 LAB — HEPATITIS C ANTIBODY: Hep C Virus Ab: NONREACTIVE

## 2022-01-11 ENCOUNTER — Other Ambulatory Visit: Payer: Self-pay

## 2022-01-11 DIAGNOSIS — R748 Abnormal levels of other serum enzymes: Secondary | ICD-10-CM

## 2022-01-13 ENCOUNTER — Ambulatory Visit: Payer: BC Managed Care – PPO | Admitting: Surgery

## 2022-01-13 ENCOUNTER — Other Ambulatory Visit: Payer: Self-pay

## 2022-01-13 ENCOUNTER — Encounter: Payer: Self-pay | Admitting: Surgery

## 2022-01-13 VITALS — BP 115/77 | HR 61 | Temp 98.6°F | Ht 61.0 in | Wt 128.0 lb

## 2022-01-13 DIAGNOSIS — K449 Diaphragmatic hernia without obstruction or gangrene: Secondary | ICD-10-CM

## 2022-01-13 DIAGNOSIS — Z09 Encounter for follow-up examination after completed treatment for conditions other than malignant neoplasm: Secondary | ICD-10-CM

## 2022-01-13 DIAGNOSIS — R748 Abnormal levels of other serum enzymes: Secondary | ICD-10-CM | POA: Diagnosis not present

## 2022-01-13 NOTE — Patient Instructions (Signed)
Please call the office with any question or concerns.

## 2022-01-13 NOTE — Progress Notes (Unsigned)
Outpatient Surgical Follow Up  01/13/2022  Sharon Branch is an 61 y.o. female.   Chief Complaint  Patient presents with   Routine Post Op    Hiatal hernia repair    HPI: Paraesophageal hernia 3-1/2 months ago.  Doing very well.  No dysphagia no reflux no cough no pulmonary symptoms.  She is very pleased with the results.  No fevers or chills.  She is taking p.o. well.  She endorses no concerns at this time  Past Medical History:  Diagnosis Date   GERD (gastroesophageal reflux disease)    Hemorrhoid    History of hiatal hernia    History of kidney stones    IBS (irritable bowel syndrome)    PONV (postoperative nausea and vomiting)    Rosacea     Past Surgical History:  Procedure Laterality Date   CESAREAN SECTION  03/02/1991   COLONOSCOPY     x2   ESOPHAGOGASTRODUODENOSCOPY (EGD) WITH PROPOFOL N/A 08/10/2021   Procedure: ESOPHAGOGASTRODUODENOSCOPY (EGD) WITH PROPOFOL;  Surgeon: Toney Reil, MD;  Location: [facility];  Service: Gastroenterology;  Laterality: N/A;   LAPAROSCOPIC HYSTERECTOMY  03/01/2000   LASIK     tummy tuck     XI ROBOTIC ASSISTED PARAESOPHAGEAL HERNIA REPAIR N/A 10/01/2021   Procedure: XI ROBOTIC ASSISTED PARAESOPHAGEAL HERNIA REPAIR, RNFA to assist;  Surgeon: Leafy Ro, MD;  Location: [facility] ORS;  Service: General;  Laterality: N/A;    Family History  Problem Relation Age of Onset   Breast cancer Mother     Social History:  reports that she has never smoked. She has never used smokeless tobacco. She reports that she does not drink alcohol and does not use drugs.  Allergies:  Allergies  Allergen Reactions   Codeine Nausea And Vomiting    Medications reviewed.    ROS Full ROS performed and is otherwise negative other than what is stated in HPI   BP 115/77   Pulse 61   Temp 98.6 F (37 C) (Oral)   Ht 5\' 1"  (1.549 m)   Wt 128 lb (58.1 kg)   SpO2 99%   BMI 24.19 kg/m   Physical Exam Vitals and nursing note reviewed.  Exam conducted with a chaperone present.  Constitutional:      General: She is not in acute distress.    Appearance: Normal appearance.  Cardiovascular:     Rate and Rhythm: Normal rate and regular rhythm.     Heart sounds: Normal heart sounds. No murmur heard. Pulmonary:     Effort: Pulmonary effort is normal.     Breath sounds: Normal breath sounds.  Abdominal:     General: Abdomen is flat. There is no distension.     Palpations: Abdomen is soft. There is no mass.     Tenderness: There is no abdominal tenderness. There is no guarding.     Hernia: No hernia is present.     Comments: Incisions healed , no infection  Musculoskeletal:     Cervical back: Normal range of motion and neck supple. No rigidity.  Skin:    General: Skin is warm and dry.     Capillary Refill: Capillary refill takes less than 2 seconds.  Neurological:     General: No focal deficit present.     Mental Status: She is alert and oriented to person, place, and time.  Psychiatric:        Mood and Affect: Mood normal.        Behavior: Behavior normal.  Thought Content: Thought content normal.        Judgment: Judgment normal.        Assessment/Plan: Doing very well after paraesophageal hernia repair without any complications. We will see her on a as needed basis Please note that I spent 20 minutes in this encounter including personally reviewing imaging studies, records, counseling the patient, placing orders and performing appropriate documentation    Sterling Big, MD [facility] General Surgeon

## 2022-01-14 ENCOUNTER — Encounter: Payer: Self-pay | Admitting: Surgery

## 2022-01-14 LAB — COMPREHENSIVE METABOLIC PANEL
ALT: 11 IU/L (ref 0–32)
AST: 20 IU/L (ref 0–40)
Albumin/Globulin Ratio: 2.1 (ref 1.2–2.2)
Albumin: 4.1 g/dL (ref 3.8–4.9)
Alkaline Phosphatase: 202 IU/L — ABNORMAL HIGH (ref 44–121)
BUN/Creatinine Ratio: 22 (ref 12–28)
BUN: 17 mg/dL (ref 8–27)
Bilirubin Total: 0.2 mg/dL (ref 0.0–1.2)
CO2: 27 mmol/L (ref 20–29)
Calcium: 8.7 mg/dL (ref 8.7–10.3)
Chloride: 101 mmol/L (ref 96–106)
Creatinine, Ser: 0.79 mg/dL (ref 0.57–1.00)
Globulin, Total: 2 g/dL (ref 1.5–4.5)
Glucose: 101 mg/dL — ABNORMAL HIGH (ref 70–99)
Potassium: 4.7 mmol/L (ref 3.5–5.2)
Sodium: 141 mmol/L (ref 134–144)
Total Protein: 6.1 g/dL (ref 6.0–8.5)
eGFR: 86 mL/min/{1.73_m2} (ref >59)

## 2022-03-08 NOTE — Progress Notes (Unsigned)
      Established patient visit   Patient: Sharon Branch   DOB: 08/28/1960   62 y.o. Female  MRN: [id number] Visit Date: 03/09/2022  Today's healthcare provider: Eulis Foster, MD   No chief complaint on file.  Subjective    Patient presents for follow-up for abnormal alkaline she states that she has not had any right-sided abdominal pain She is agreeable to repeat labs today She denies fatigue, bone pain, symptoms of depression        Medications: Outpatient Medications Prior to Visit  Medication Sig   doxycycline (VIBRAMYCIN) 100 MG capsule Take 1 capsule (100 mg total) by mouth 2 (two) times daily.   escitalopram (LEXAPRO) 10 MG tablet Take 1 tablet (10 mg total) by mouth at bedtime.   No facility-administered medications prior to visit.    Review of Systems  Constitutional:  Negative for appetite change.  Gastrointestinal:  Negative for abdominal distention and abdominal pain.       Objective    BP 119/66 (BP Location: Right Arm, Patient Position: Sitting, Cuff Size: Normal)   Pulse 64   Ht 5\' 1"  (1.549 m)   Wt 130 lb 6.4 oz (59.1 kg)   SpO2 99%   BMI 24.64 kg/m    Physical Exam Vitals reviewed.  Constitutional:      General: She is not in acute distress.    Appearance: Normal appearance. She is not ill-appearing, toxic-appearing or diaphoretic.  Eyes:     Conjunctiva/sclera: Conjunctivae normal.  Cardiovascular:     Rate and Rhythm: Normal rate and regular rhythm.     Pulses: Normal pulses.     Heart sounds: Normal heart sounds. No murmur heard.    No friction rub. No gallop.  Pulmonary:     Effort: Pulmonary effort is normal. No respiratory distress.     Breath sounds: Normal breath sounds. No stridor. No wheezing, rhonchi or rales.  Abdominal:     General: Abdomen is flat. Bowel sounds are normal. There is no distension.     Palpations: Abdomen is soft. There is no shifting dullness, fluid wave, hepatomegaly, splenomegaly, mass or  pulsatile mass.     Tenderness: There is no abdominal tenderness.  Musculoskeletal:     Right lower leg: No edema.     Left lower leg: No edema.  Skin:    Findings: No erythema or rash.  Neurological:     Mental Status: She is alert and oriented to person, place, and time.     No results found for any visits on 03/09/22.  Assessment & Plan     Problem List Items Addressed This Visit       Other   Elevated alkaline phosphatase measurement - Primary    Elevated alk phos, greater than 200 We will repeat CMP We will also obtain vitamin D levels and GGT Follow-up depending on results Discussed potential causes of elevated alk phos, patient voiced understanding      Relevant Orders   Gamma GT   Comprehensive metabolic panel   Vitamin D (25 hydroxy)     Return Pending results of labs.      I, Eulis Foster, MD, have reviewed all documentation for this visit.  Portions of this information were initially documented by the CMA and reviewed by me for thoroughness and accuracy.      Eulis Foster, MD  [facility] [phone] (phone) [phone] (fax)  [place]

## 2022-03-09 ENCOUNTER — Ambulatory Visit: Payer: BC Managed Care – PPO | Admitting: Family Medicine

## 2022-03-09 ENCOUNTER — Encounter: Payer: Self-pay | Admitting: Family Medicine

## 2022-03-09 VITALS — BP 119/66 | HR 64 | Ht 61.0 in | Wt 130.4 lb

## 2022-03-09 DIAGNOSIS — E559 Vitamin D deficiency, unspecified: Secondary | ICD-10-CM | POA: Diagnosis not present

## 2022-03-09 DIAGNOSIS — R748 Abnormal levels of other serum enzymes: Secondary | ICD-10-CM | POA: Diagnosis not present

## 2022-03-09 NOTE — Assessment & Plan Note (Signed)
Elevated alk phos, greater than 200 We will repeat CMP We will also obtain vitamin D levels and GGT Follow-up depending on results Discussed potential causes of elevated alk phos, patient voiced understanding

## 2022-03-10 LAB — COMPREHENSIVE METABOLIC PANEL
ALT: 11 IU/L (ref 0–32)
AST: 18 IU/L (ref 0–40)
Albumin/Globulin Ratio: 2.2 (ref 1.2–2.2)
Albumin: 4.1 g/dL (ref 3.8–4.9)
Alkaline Phosphatase: 191 IU/L — ABNORMAL HIGH (ref 44–121)
BUN/Creatinine Ratio: 24 (ref 12–28)
BUN: 14 mg/dL (ref 8–27)
Bilirubin Total: 0.4 mg/dL (ref 0.0–1.2)
CO2: 24 mmol/L (ref 20–29)
Calcium: 8.7 mg/dL (ref 8.7–10.3)
Chloride: 105 mmol/L (ref 96–106)
Creatinine, Ser: 0.59 mg/dL (ref 0.57–1.00)
Globulin, Total: 1.9 g/dL (ref 1.5–4.5)
Glucose: 94 mg/dL (ref 70–99)
Potassium: 4.5 mmol/L (ref 3.5–5.2)
Sodium: 141 mmol/L (ref 134–144)
Total Protein: 6 g/dL (ref 6.0–8.5)
eGFR: 103 mL/min/{1.73_m2} (ref >59)

## 2022-03-10 LAB — GAMMA GT: GGT: 7 IU/L (ref 0–60)

## 2022-03-10 LAB — VITAMIN D 25 HYDROXY (VIT D DEFICIENCY, FRACTURES): Vit D, 25-Hydroxy: 29.5 ng/mL — ABNORMAL LOW (ref 30.0–100.0)

## 2022-03-12 ENCOUNTER — Other Ambulatory Visit: Payer: Self-pay

## 2022-03-12 DIAGNOSIS — R748 Abnormal levels of other serum enzymes: Secondary | ICD-10-CM

## 2022-03-12 DIAGNOSIS — E559 Vitamin D deficiency, unspecified: Secondary | ICD-10-CM

## 2022-04-07 ENCOUNTER — Encounter (HOSPITAL_COMMUNITY): Payer: Self-pay

## 2022-04-07 ENCOUNTER — Ambulatory Visit (HOSPITAL_COMMUNITY): Admit: 2022-04-07 | Payer: BC Managed Care – PPO | Admitting: Gastroenterology

## 2022-04-07 SURGERY — MANOMETRY, ESOPHAGUS
Anesthesia: Choice

## 2022-05-13 ENCOUNTER — Encounter: Payer: Self-pay | Admitting: Family Medicine

## 2022-05-13 DIAGNOSIS — E559 Vitamin D deficiency, unspecified: Secondary | ICD-10-CM | POA: Insufficient documentation

## 2022-05-14 IMAGING — MG MM DIGITAL SCREENING BILAT W/ TOMO AND CAD
8 series · 8 of 24 positions shown · non-contrast
Comparison: Previous exam(s).

CLINICAL DATA: Screening.

EXAM:
DIGITAL SCREENING BILATERAL MAMMOGRAM WITH TOMOSYNTHESIS AND CAD
TECHNIQUE: Bilateral screening digital craniocaudal and mediolateral oblique
mammograms were obtained. Bilateral screening digital breast
tomosynthesis was performed. The images were evaluated with
computer-aided detection.

[R CC synth-2D]
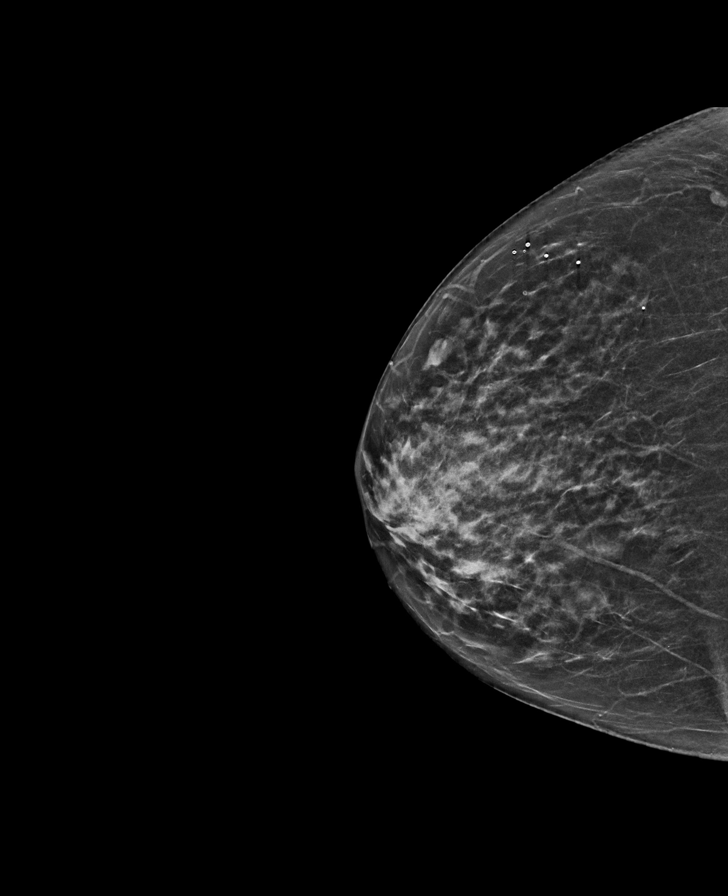

[L MLO synth-2D]
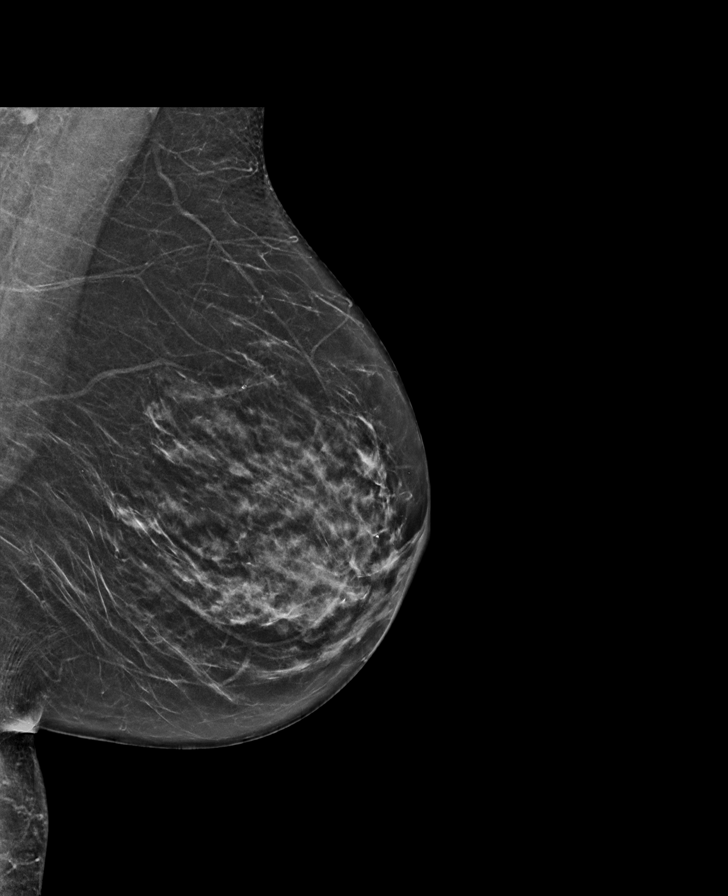

[L CC synth-2D]
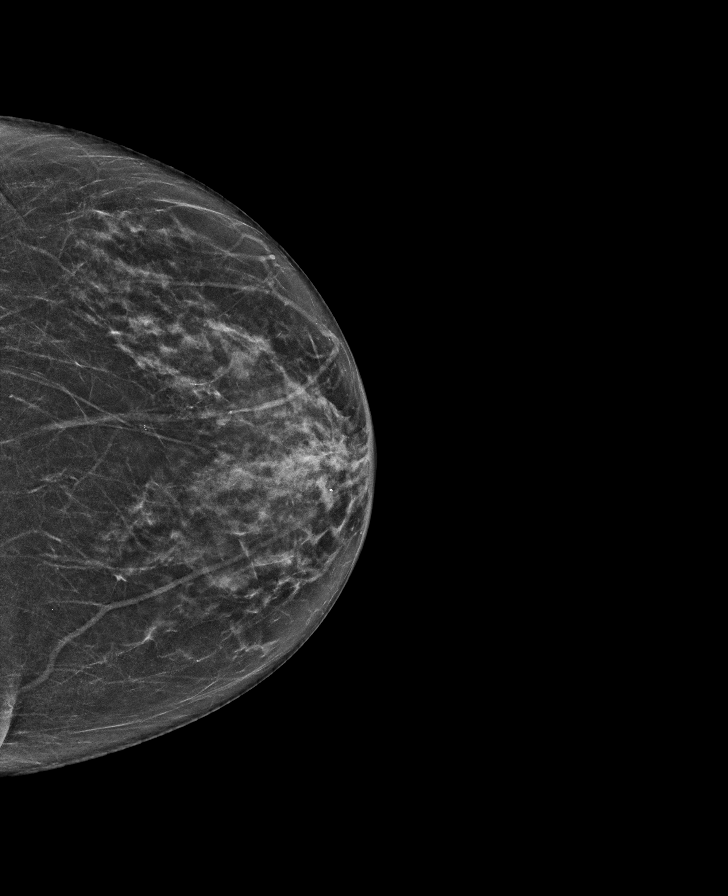

[R MLO synth-2D]
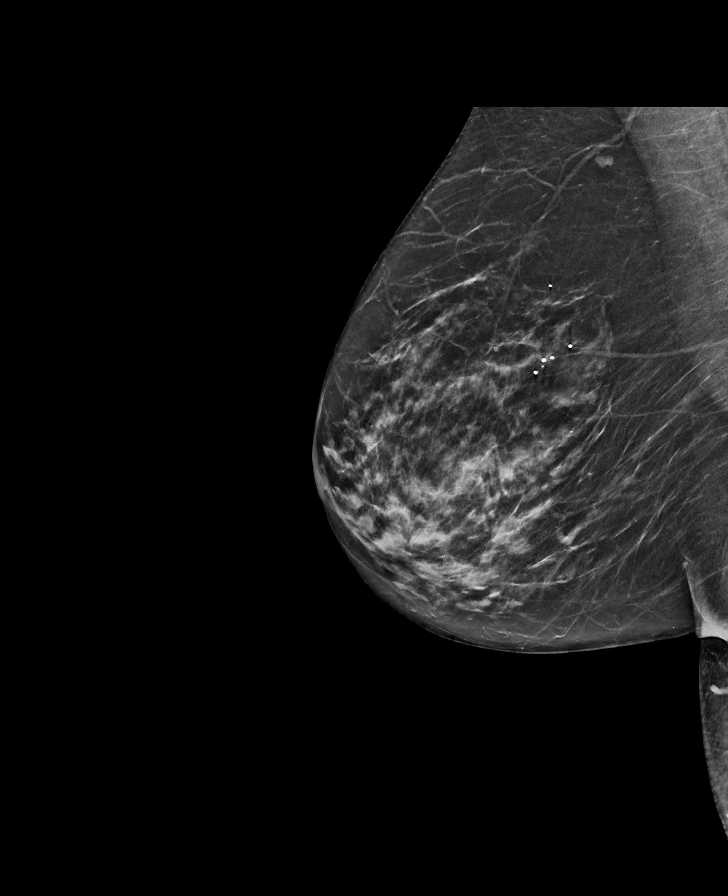

[R CC tomo · tomo slice 27/54.0]
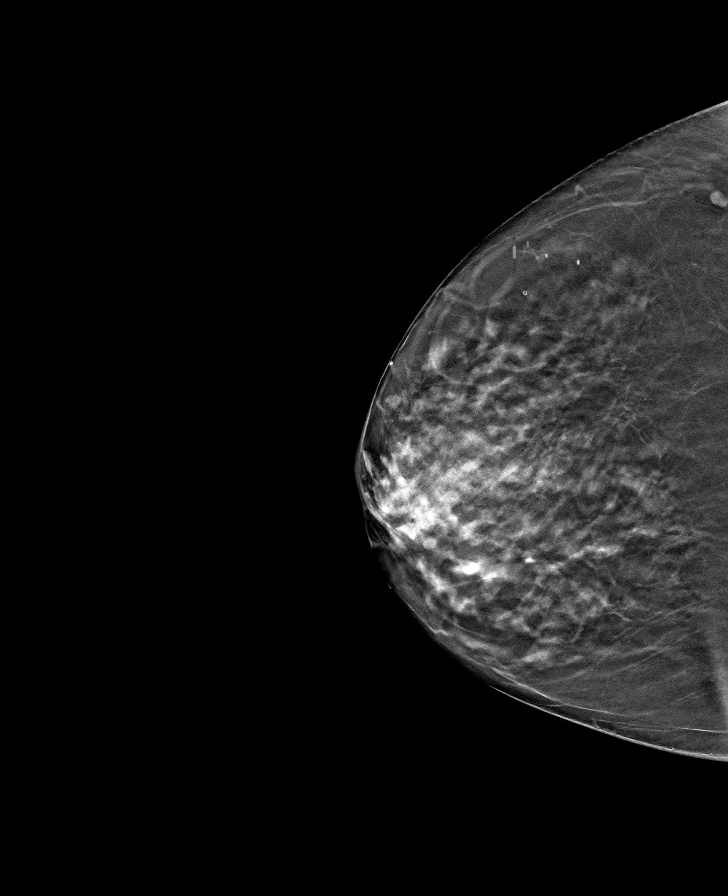

[L CC tomo · tomo slice 29/57.0]
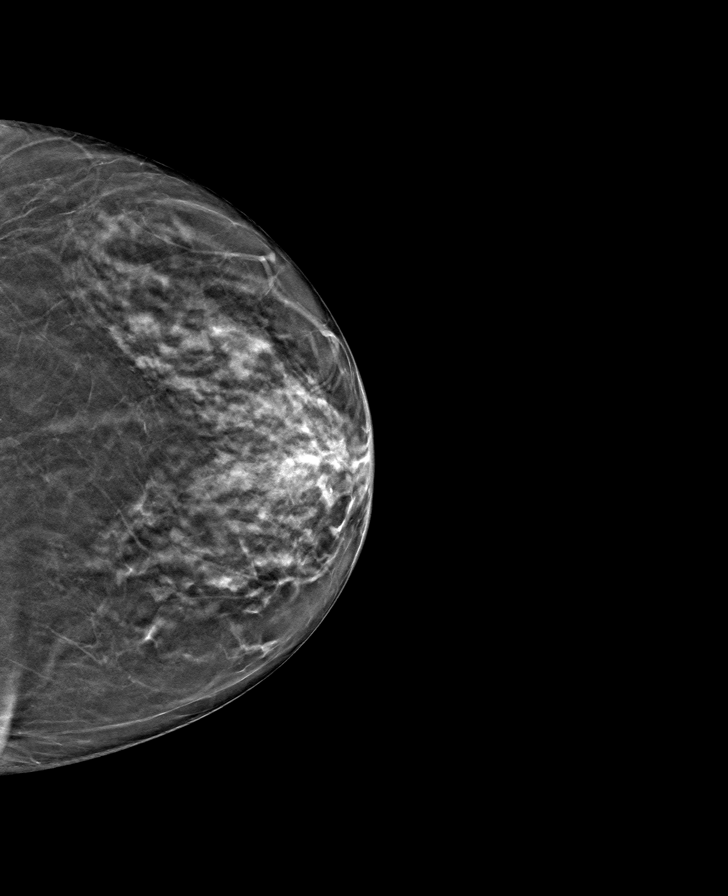

[L MLO tomo · tomo slice 31/62.0]
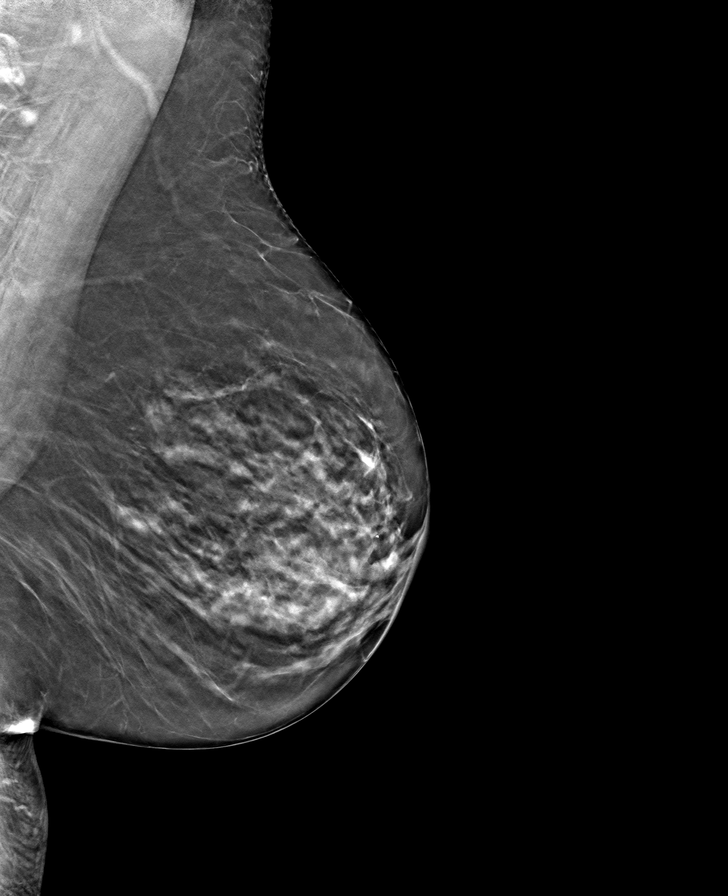

[R MLO tomo · tomo slice 31/61.0]
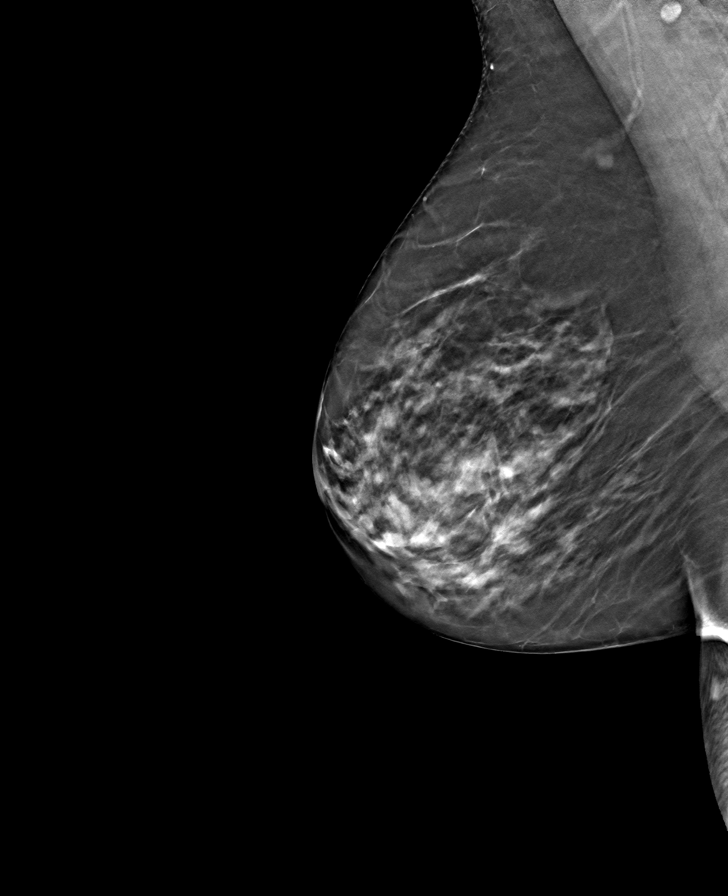

[8 of 24 positions shown; findings below may reference images not displayed]

ACR Breast Density Category c: The breast tissue is heterogeneously
dense, which may obscure small masses.
FINDINGS: There are no findings suspicious for malignancy.
IMPRESSION: No mammographic evidence of malignancy. A result letter of this
screening mammogram will be mailed directly to the patient.

RECOMMENDATION:
Screening mammogram in one year. (Code:Q3-W-BC3)

BI-RADS CATEGORY  1: Negative.

## 2022-06-07 ENCOUNTER — Other Ambulatory Visit: Payer: Self-pay

## 2022-06-07 DIAGNOSIS — E559 Vitamin D deficiency, unspecified: Secondary | ICD-10-CM

## 2022-06-07 DIAGNOSIS — R748 Abnormal levels of other serum enzymes: Secondary | ICD-10-CM

## 2022-06-08 LAB — COMPREHENSIVE METABOLIC PANEL
ALT: 12 IU/L (ref 0–32)
AST: 18 IU/L (ref 0–40)
Albumin/Globulin Ratio: 2.1 (ref 1.2–2.2)
Albumin: 3.9 g/dL (ref 3.9–4.9)
Alkaline Phosphatase: 206 IU/L — ABNORMAL HIGH (ref 44–121)
BUN/Creatinine Ratio: 25 (ref 12–28)
BUN: 17 mg/dL (ref 8–27)
Bilirubin Total: 0.2 mg/dL (ref 0.0–1.2)
CO2: 23 mmol/L (ref 20–29)
Calcium: 8.8 mg/dL (ref 8.7–10.3)
Chloride: 105 mmol/L (ref 96–106)
Creatinine, Ser: 0.68 mg/dL (ref 0.57–1.00)
Globulin, Total: 1.9 g/dL (ref 1.5–4.5)
Glucose: 131 mg/dL — ABNORMAL HIGH (ref 70–99)
Potassium: 4 mmol/L (ref 3.5–5.2)
Sodium: 142 mmol/L (ref 134–144)
Total Protein: 5.8 g/dL — ABNORMAL LOW (ref 6.0–8.5)
eGFR: 99 mL/min/{1.73_m2} (ref >59)

## 2022-06-08 LAB — VITAMIN D 25 HYDROXY (VIT D DEFICIENCY, FRACTURES): Vit D, 25-Hydroxy: 47.6 ng/mL (ref 30.0–100.0)

## 2022-06-11 ENCOUNTER — Other Ambulatory Visit: Payer: Self-pay

## 2022-06-11 DIAGNOSIS — R748 Abnormal levels of other serum enzymes: Secondary | ICD-10-CM

## 2022-06-14 ENCOUNTER — Ambulatory Visit: Payer: BC Managed Care – PPO | Admitting: Family Medicine

## 2022-06-18 ENCOUNTER — Ambulatory Visit
Admission: RE | Admit: 2022-06-18 | Discharge: 2022-06-18 | Disposition: A | Discharge status: Home / Self Care | Payer: BC Managed Care – PPO | Source: Ambulatory Visit | Attending: Family Medicine | Admitting: Family Medicine

## 2022-06-18 DIAGNOSIS — R945 Abnormal results of liver function studies: Secondary | ICD-10-CM | POA: Diagnosis not present

## 2022-06-18 DIAGNOSIS — R748 Abnormal levels of other serum enzymes: Secondary | ICD-10-CM | POA: Diagnosis not present

## 2022-07-20 ENCOUNTER — Encounter: Payer: Self-pay | Admitting: Family Medicine

## 2022-08-12 ENCOUNTER — Encounter: Payer: Self-pay | Admitting: Family Medicine

## 2022-08-12 ENCOUNTER — Ambulatory Visit: Payer: BC Managed Care – PPO | Admitting: Family Medicine

## 2022-08-12 VITALS — BP 123/81 | HR 58 | Temp 97.8°F | Resp 12 | Wt 133.7 lb

## 2022-08-12 DIAGNOSIS — E559 Vitamin D deficiency, unspecified: Secondary | ICD-10-CM

## 2022-08-12 DIAGNOSIS — R1319 Other dysphagia: Secondary | ICD-10-CM | POA: Diagnosis not present

## 2022-08-12 DIAGNOSIS — R748 Abnormal levels of other serum enzymes: Secondary | ICD-10-CM

## 2022-08-12 DIAGNOSIS — Z9889 Other specified postprocedural states: Secondary | ICD-10-CM

## 2022-08-12 DIAGNOSIS — Z8719 Personal history of other diseases of the digestive system: Secondary | ICD-10-CM

## 2022-08-12 DIAGNOSIS — K219 Gastro-esophageal reflux disease without esophagitis: Secondary | ICD-10-CM

## 2022-08-12 DIAGNOSIS — R5383 Other fatigue: Secondary | ICD-10-CM

## 2022-08-12 MED ORDER — OMEPRAZOLE 20 MG PO CPDR: 20.0000 mg | DELAYED_RELEASE_CAPSULE | Freq: Every day | ORAL | 3 refills | Status: DC

## 2022-08-12 NOTE — Assessment & Plan Note (Addendum)
Subacute problem Patient with difficulty swallowing during esophageal phase status post Esophageal hernia repair Recommended that she restart omeprazole 20 mg daily Will submit referral to gastroenterology to evaluate if patient will need repeat EGD and evaluate for stricture We will collect CBC, CMP, vitamin B12 to evaluate for any nutritional deficiencies that may be contributing to her symptoms

## 2022-08-12 NOTE — Progress Notes (Addendum)
I,Sulibeya S Dimas,acting as a Neurosurgeon for [employer], MD.,have documented all relevant documentation on the behalf of Ronnald Ramp, MD,as directed by  Ronnald Ramp, MD while in the presence of Ronnald Ramp, MD.     Established patient visit   Patient: Sharon Branch   DOB: 06-09-1960   62 y.o. Female  MRN: [id number] Visit Date: 08/12/2022  Today's healthcare provider: Ronnald Ramp, MD   Chief Complaint  Patient presents with   Gastroesophageal Reflux   Subjective    HPI   GI Concerns  Had hernia surgery and that went well. She reports heartburn, acid reflux, and has a hard time swallowing food. States that she has to eat slowly and wait for the food to pass through her esophagus. Reports having nausea and emesis if she does not eat slowly enough. She reports symptoms have been persistent since around December. She reports taking OTC Omeprazole 20 mg for 3 months. She reports the medication is suppose to be used only for 14 days. She reports she has been off medication for about 3 days. She reports good symptom control on medication. She reports she did well after her surgery which was done on 10/02/22.    Decreased energy Patient states that she is also feeling more tired since the symptoms have began.  She states that she has also gained weight.  Patient notes her age and states that she is not really to feel fatigued.  Patient reports history of vitamin D deficiency.   Medications: Outpatient Medications Prior to Visit  Medication Sig   doxycycline (VIBRAMYCIN) 100 MG capsule Take 1 capsule (100 mg total) by mouth 2 (two) times daily.   escitalopram (LEXAPRO) 10 MG tablet Take 1 tablet (10 mg total) by mouth at bedtime.   No facility-administered medications prior to visit.    Review of Systems  Constitutional:  Negative for appetite change, chills, fatigue and fever.  Respiratory:  Negative for cough, choking,  chest tightness and shortness of breath.   Gastrointestinal:  Positive for abdominal distention, abdominal pain, nausea and vomiting. Negative for blood in stool, constipation and diarrhea.       Objective    BP 123/81 (BP Location: Left Arm, Patient Position: Sitting, Cuff Size: Normal)   Pulse (!) 58   Temp 97.8 F (36.6 C) (Temporal)   Resp 12   Wt 133 lb 11.2 oz (60.6 kg)   SpO2 99%   BMI 25.26 kg/m    Physical Exam Cardiovascular:     Rate and Rhythm: Normal rate and regular rhythm.  Pulmonary:     Effort: No respiratory distress.     Breath sounds: No wheezing.  Abdominal:     General: A surgical scar is present. Bowel sounds are normal. There is no distension. There are no signs of injury.     Palpations: There is no shifting dullness, fluid wave, hepatomegaly or mass.     Tenderness: There is no abdominal tenderness.       Results for orders placed or performed in visit on 08/12/22  Comprehensive metabolic panel  Result Value Ref Range   Glucose 91 70 - 99 mg/dL   BUN 15 8 - 27 mg/dL   Creatinine, Ser 4.09 0.57 - 1.00 mg/dL   eGFR 94 >81 XB/JYN/8.29   BUN/Creatinine Ratio 21 12 - 28   Sodium 141 134 - 144 mmol/L   Potassium 4.6 3.5 - 5.2 mmol/L   Chloride 103 96 - 106 mmol/L   CO2 26  20 - 29 mmol/L   Calcium 9.2 8.7 - 10.3 mg/dL   Total Protein 5.9 (L) 6.0 - 8.5 g/dL   Albumin 4.1 3.9 - 4.9 g/dL   Globulin, Total 1.8 1.5 - 4.5 g/dL   Albumin/Globulin Ratio 2.3    Bilirubin Total 0.3 0.0 - 1.2 mg/dL   Alkaline Phosphatase 203 (H) 44 - 121 IU/L   AST 21 0 - 40 IU/L   ALT 10 0 - 32 IU/L  Vitamin B12  Result Value Ref Range   Vitamin B-12 503 232 - 1,245 pg/mL  CBC  Result Value Ref Range   WBC 4.9 3.4 - 10.8 x10E3/uL   RBC 4.36 3.77 - 5.28 x10E6/uL   Hemoglobin 12.4 11.1 - 15.9 g/dL   Hematocrit 96.0 45.4 - 46.6 %   MCV 87 79 - 97 fL   MCH 28.4 26.6 - 33.0 pg   MCHC 32.5 31.5 - 35.7 g/dL   RDW 09.8 11.9 - 14.7 %   Platelets 236 150 - 450 x10E3/uL   Vitamin D (25 hydroxy)  Result Value Ref Range   Vit D, 25-Hydroxy 38.0 30.0 - 100.0 ng/mL  Gamma GT  Result Value Ref Range   GGT 5 0 - 60 IU/L  PSA  Result Value Ref Range   Prostate Specific Ag, Serum <0.1 Not Estab. ng/mL    Assessment & Plan     Problem List Items Addressed This Visit     Elevated alkaline phosphatase measurement    Chronically elevated alk phos in 190s-200  Normal GGT in Jan 2024  Will recheck levels today with CMP and GGT  Recommended Gastro referral for further recommendations as well  Patient currently asymptomatic       Relevant Orders   Comprehensive metabolic panel (Completed)   Vitamin B12 (Completed)   CBC (Completed)   Vitamin D (25 hydroxy) (Completed)   Gamma GT (Completed)   Esophageal dysphagia - Primary    Subacute problem Patient with difficulty swallowing during esophageal phase status post Esophageal hernia repair Recommended that she restart omeprazole 20 mg daily Will submit referral to gastroenterology to evaluate if patient will need repeat EGD and evaluate for stricture We will collect CBC, CMP, vitamin B12 to evaluate for any nutritional deficiencies that may be contributing to her symptoms        Relevant Medications   omeprazole (PRILOSEC) 20 MG capsule   Other Relevant Orders   Ambulatory referral to Gastroenterology   S/P repair of paraesophageal hernia   Relevant Orders   Ambulatory referral to Gastroenterology   Vitamin D deficiency    Chronic  Reports decreased energy levels  In setting of elevated alk phos, will recheck Vitamin D levels today  Recommended she continue MV for now      Relevant Orders   Vitamin D (25 hydroxy) (Completed)   Other Visit Diagnoses     Decreased energy       Relevant Orders   Comprehensive metabolic panel (Completed)   Vitamin B12 (Completed)   CBC (Completed)   Vitamin D (25 hydroxy) (Completed)   Gastroesophageal reflux disease, unspecified whether esophagitis present        Relevant Medications   omeprazole (PRILOSEC) 20 MG capsule        No follow-ups on file.       The entirety of the information documented in the History of Present Illness, Review of Systems and Physical Exam were personally obtained by me. Portions of this information were initially documented by  Sulibeya Dimas,CMA . I, Ronnald Ramp, MD have reviewed the documentation above for thoroughness and accuracy.      Ronnald Ramp, MD  [facility] [phone] (phone) [phone] (fax)  [facility]

## 2022-08-12 NOTE — Assessment & Plan Note (Signed)
Chronic  Reports decreased energy levels  In setting of elevated alk phos, will recheck Vitamin D levels today  Recommended she continue MV for now

## 2022-08-12 NOTE — Assessment & Plan Note (Signed)
Chronically elevated alk phos in 190s-200  Normal GGT in Jan 2024  Will recheck levels today with CMP and GGT  Recommended Gastro referral for further recommendations as well  Patient currently asymptomatic

## 2022-08-13 LAB — CBC
Hematocrit: 38.1 % (ref 34.0–46.6)
Hemoglobin: 12.4 g/dL (ref 11.1–15.9)
MCH: 28.4 pg (ref 26.6–33.0)
MCHC: 32.5 g/dL (ref 31.5–35.7)
MCV: 87 fL (ref 79–97)
Platelets: 236 10*3/uL (ref 150–450)
RBC: 4.36 x10E6/uL (ref 3.77–5.28)
RDW: 13.3 % (ref 11.7–15.4)
WBC: 4.9 10*3/uL (ref 3.4–10.8)

## 2022-08-13 LAB — COMPREHENSIVE METABOLIC PANEL
ALT: 10 IU/L (ref 0–32)
AST: 21 IU/L (ref 0–40)
Albumin/Globulin Ratio: 2.3
Albumin: 4.1 g/dL (ref 3.9–4.9)
Alkaline Phosphatase: 203 IU/L — ABNORMAL HIGH (ref 44–121)
BUN/Creatinine Ratio: 21 (ref 12–28)
BUN: 15 mg/dL (ref 8–27)
Bilirubin Total: 0.3 mg/dL (ref 0.0–1.2)
CO2: 26 mmol/L (ref 20–29)
Calcium: 9.2 mg/dL (ref 8.7–10.3)
Chloride: 103 mmol/L (ref 96–106)
Creatinine, Ser: 0.73 mg/dL (ref 0.57–1.00)
Globulin, Total: 1.8 g/dL (ref 1.5–4.5)
Glucose: 91 mg/dL (ref 70–99)
Potassium: 4.6 mmol/L (ref 3.5–5.2)
Sodium: 141 mmol/L (ref 134–144)
Total Protein: 5.9 g/dL — ABNORMAL LOW (ref 6.0–8.5)
eGFR: 94 mL/min/{1.73_m2} (ref >59)

## 2022-08-13 LAB — GAMMA GT: GGT: 5 IU/L (ref 0–60)

## 2022-08-13 LAB — VITAMIN B12: Vitamin B-12: 503 pg/mL (ref 232–1245)

## 2022-08-13 LAB — PSA: Prostate Specific Ag, Serum: <0.1 ng/mL

## 2022-08-13 LAB — VITAMIN D 25 HYDROXY (VIT D DEFICIENCY, FRACTURES): Vit D, 25-Hydroxy: 38 ng/mL (ref 30.0–100.0)

## 2022-09-16 ENCOUNTER — Ambulatory Visit: Payer: BC Managed Care – PPO | Admitting: Podiatry

## 2022-09-16 DIAGNOSIS — M7751 Other enthesopathy of right foot: Secondary | ICD-10-CM | POA: Diagnosis not present

## 2022-09-16 DIAGNOSIS — Q666 Other congenital valgus deformities of feet: Secondary | ICD-10-CM | POA: Diagnosis not present

## 2022-09-16 NOTE — Progress Notes (Signed)
  Subjective:  Patient ID: Sharon Branch, female    DOB: 1960-04-07,  MRN: [id number]  Chief Complaint  Patient presents with   Foot Pain    Pt stated that she has been having pain on the top of her foot     62 y.o. female presents with the above complaint.  Patient presents for right third metatarsophalangeal joint pain.  Patient states pain for touch is progressive gotten worse worse with ambulation hurts with pressure she says it came out of nowhere and has been hurting her.  Pain scale 7 out of 10 dull achy in nature she does not wear any orthotics.  She denies seeing anyone else prior to seeing me for this.   Review of Systems: Negative except as noted in the HPI. Denies N/V/F/Ch.  Past Medical History:  Diagnosis Date   GERD (gastroesophageal reflux disease)    Hemorrhoid    History of hiatal hernia    History of kidney stones    IBS (irritable bowel syndrome)    PONV (postoperative nausea and vomiting)    Rosacea     Current Outpatient Medications:    doxycycline (VIBRAMYCIN) 100 MG capsule, Take 1 capsule (100 mg total) by mouth 2 (two) times daily., Disp: 180 capsule, Rfl: 2   escitalopram (LEXAPRO) 10 MG tablet, Take 1 tablet (10 mg total) by mouth at bedtime., Disp: 90 tablet, Rfl: 3   omeprazole (PRILOSEC) 20 MG capsule, Take 1 capsule (20 mg total) by mouth daily., Disp: 30 capsule, Rfl: 3  Social History   Tobacco Use  Smoking Status Never  Smokeless Tobacco Never    Allergies  Allergen Reactions   Codeine Nausea And Vomiting   Objective:  There were no vitals filed for this visit. There is no height or weight on file to calculate BMI. Constitutional Well developed. Well nourished.  Vascular Dorsalis pedis pulses palpable bilaterally. Posterior tibial pulses palpable bilaterally. Capillary refill normal to all digits.  No cyanosis or clubbing noted. Pedal hair growth normal.  Neurologic Normal speech. Oriented to person, place, and time. Epicritic  sensation to light touch grossly present bilaterally.  Dermatologic Nails well groomed and normal in appearance. No open wounds. No skin lesions.  Orthopedic: Pain on palpation of right third metatarsophalangeal joint pain with range of motion of the joint deep intra-articular pain noted.  No other abnormalities identified no other metatarsophalangeal joints are inflamed   Radiographs: None Assessment:  No diagnosis found. Plan:  Patient was evaluated and treated and all questions answered.  Right third MTP capsulitis -All action and core concerns were discussed with the patient extensively given the amount of pain that she is under she will benefit from a steroid injection of decrease inflammatory component surgical pain.  Patient agreeable plan like to proceed with steroid injection. -A steroid injection was performed at right third metatarsophalangeal joint using 1% plain Lidocaine and 10 mg of Kenalog. This was well tolerated.  Pes planovalgus -I explained to patient the etiology of pes planovalgus and relationship with Planter fasciitis and various treatment options were discussed.  Given patient foot structure in the setting of Planter fasciitis I believe patient will benefit from custom-made orthotics to help control the hindfoot motion support the arch of the foot and take the stress away from plantar fascial.  Patient agrees with the plan like to proceed with orthotics -Patient was casted for orthotics with offloading of the third metatarsal phalangeal joint

## 2022-09-27 ENCOUNTER — Other Ambulatory Visit: Payer: Self-pay

## 2022-09-27 ENCOUNTER — Encounter: Payer: Self-pay | Admitting: Gastroenterology

## 2022-09-27 ENCOUNTER — Ambulatory Visit: Payer: BC Managed Care – PPO | Admitting: Gastroenterology

## 2022-09-27 VITALS — BP 115/71 | HR 71 | Temp 98.2°F | Ht 61.0 in | Wt 133.2 lb

## 2022-09-27 DIAGNOSIS — R1319 Other dysphagia: Secondary | ICD-10-CM

## 2022-09-27 DIAGNOSIS — R1310 Dysphagia, unspecified: Secondary | ICD-10-CM

## 2022-09-27 NOTE — Progress Notes (Signed)
[name], MD [address]  [address]  [place], [place] [address]  Main: [phone]  Fax: [phone]    Gastroenterology Consultation  Referring Provider:     Brett Albino* Primary Care Physician:  Ronnald Ramp, MD Primary Gastroenterologist:  Dr. [name] Reason for Consultation: Epigastric pain, history of hiatal hernia, s/p repair, recurrence of reflux        HPI:   Sharon Branch is a 62 y.o. female referred by Dr. Ronnald Ramp, MD  for consultation & management of epigastric pain.  Patient reports that she has been experiencing intermittent episodes of epigastric pain, sensation of food stuck in substernal area, this occurs 1 out of 3 meals a day several times a week.  This has been ongoing for several years.  She underwent ultrasound abdomen in 2016 which was normal.  H. pylori IgG was negative.  She is recently started on Protonix 40 mg p.o. daily and does not seem to help.  Patient is very active, weight has been stable.  She denies any nausea or vomiting.  She does report episodes of regurgitation.  She denies any heartburn.  She denies any radiation of pain.  Her labs are unremarkable except for mildly elevated alkaline phosphatase levels.  Alkaline phosphatase isoenzymes were normal in the past.  Patient does have history of osteoporosis  Follow-up visit 09/27/2022 Patient underwent surgical repair of paraesophageal hernia on 10/01/2021.  For initial 3 to 4 months, she has been asymptomatic.  However, followed by days, she has been experiencing nocturnal symptoms of heartburn, regurgitation.  She also reports about once or twice per month episodes of sudden onset of vomiting without any warning.  She has started taking omeprazole 20 mg daily at bedtime which relieves her nocturnal symptoms.  She is not able to come off omeprazole at this time.  Her weight has been stable.  Patient denies any change in her dietary habits since  surgery.  She denies consumption of fruit juices or carbonated beverages.  Patient does not smoke or drink alcohol . NSAIDs: None  Antiplts/Anticoagulants/Anti thrombotics: None  GI Procedures: Colonoscopy 09/2013, reportedly normal Denies any family history of GI malignancy Upper endoscopy 08/10/2021 Normal examined duodenum 3 cm hiatal hernia Normal stomach Normal GE junction and esophagus DIAGNOSIS:  A. ESOPHAGUS; COLD BIOPSY:  - BENIGN SQUAMOUS MUCOSA WITH NO SIGNIFICANT HISTOPATHOLOGIC CHANGE.  - NO INCREASE IN INTRAEPITHELIAL EOSINOPHILS (LESS THAN 2 PER HPF).  - NEGATIVE FOR DYSPLASIA AND MALIGNANCY.   Past Medical History:  Diagnosis Date   GERD (gastroesophageal reflux disease)    Hemorrhoid    History of hiatal hernia    History of kidney stones    IBS (irritable bowel syndrome)    PONV (postoperative nausea and vomiting)    Rosacea     Past Surgical History:  Procedure Laterality Date   CESAREAN SECTION  03/02/1991   COLONOSCOPY     x2   ESOPHAGOGASTRODUODENOSCOPY (EGD) WITH PROPOFOL N/A 08/10/2021   Procedure: ESOPHAGOGASTRODUODENOSCOPY (EGD) WITH PROPOFOL;  Surgeon: Toney Reil, MD;  Location: [facility];  Service: Gastroenterology;  Laterality: N/A;   LAPAROSCOPIC HYSTERECTOMY  03/01/2000   LASIK     tummy tuck     XI ROBOTIC ASSISTED PARAESOPHAGEAL HERNIA REPAIR N/A 10/01/2021   Procedure: XI ROBOTIC ASSISTED PARAESOPHAGEAL HERNIA REPAIR, RNFA to assist;  Surgeon: Leafy Ro, MD;  Location: [facility] ORS;  Service: General;  Laterality: N/A;    Current Outpatient Medications:    doxycycline (  VIBRAMYCIN) 100 MG capsule, Take 1 capsule (100 mg total) by mouth 2 (two) times daily., Disp: 180 capsule, Rfl: 2   escitalopram (LEXAPRO) 10 MG tablet, Take 1 tablet (10 mg total) by mouth at bedtime., Disp: 90 tablet, Rfl: 3   omeprazole (PRILOSEC) 20 MG capsule, Take 1 capsule (20 mg total) by mouth daily., Disp: 30 capsule, Rfl: 3   Family History   Problem Relation Age of Onset   Breast cancer Mother      Social History   Tobacco Use   Smoking status: Never   Smokeless tobacco: Never  Vaping Use   Vaping status: Never Used  Substance Use Topics   Alcohol use: No   Drug use: No    Allergies as of 09/27/2022 - Review Complete 09/27/2022  Allergen Reaction Noted   Codeine Nausea And Vomiting 09/05/2013    Review of Systems:    All systems reviewed and negative except where noted in HPI.   Physical Exam:  BP 115/71 (BP Location: Left Arm, Patient Position: Sitting, Cuff Size: Normal)   Pulse 71   Temp 98.2 F (36.8 C) (Oral)   Ht 5\' 1"  (1.549 m)   Wt 133 lb 4 oz (60.4 kg)   BMI 25.18 kg/m  No LMP recorded. Patient has had a hysterectomy.  General:   Alert,  Well-developed, well-nourished, pleasant and cooperative in NAD Head:  Normocephalic and atraumatic. Eyes:  Sclera clear, no icterus.   Conjunctiva pink. Ears:  Normal auditory acuity. Nose:  No deformity, discharge, or lesions. Mouth:  No deformity or lesions,oropharynx pink & moist. Neck:  Supple; no masses or thyromegaly. Lungs:  Respirations even and unlabored.  Clear throughout to auscultation.   No wheezes, crackles, or rhonchi. No acute distress. Heart:  Regular rate and rhythm; no murmurs, clicks, rubs, or gallops. Abdomen:  Normal bowel sounds. Soft, non-tender and non-distended without masses, hepatosplenomegaly or hernias noted.  No guarding or rebound tenderness.   Rectal: Not performed Msk:  Symmetrical without gross deformities. Good, equal movement & strength bilaterally. Pulses:  Normal pulses noted. Extremities:  No clubbing or edema.  No cyanosis. Neurologic:  Alert and oriented x3;  grossly normal neurologically. Skin:  Intact without significant lesions or rashes. No jaundice. Psych:  Alert and cooperative. Normal mood and affect.  Imaging Studies: Reviewed  Assessment and Plan:   Sharon Branch is a 62 y.o. pleasant Caucasian  female with history of osteoporosis, history of large paraesophageal hernia s/p surgery in 09/2021 is seen in consultation for recurrence of reflux symptoms at night as well as episodes of vomiting/regurgitation and sensation of food stuck    Recommend EGD for further evaluation Continue omeprazole 20 mg daily before dinner  Follow up as needed based on the above work-up   [name], MD

## 2022-09-27 NOTE — Progress Notes (Deleted)
[name], MD [address]  [address]  [place], [place] [address]  Main: [phone]  Fax: [phone]    Gastroenterology Consultation  Referring Provider:     Brett Albino* Primary Care Physician:  Ronnald Ramp, MD Primary Gastroenterologist:  Dr. [name] Reason for Consultation:     ***        HPI:   MADLYNE QUILLMAN is a 62 y.o. female referred by Dr. Ronnald Ramp, MD  for consultation & management of ***  NSAIDs: ***  Antiplts/Anticoagulants/Anti thrombotics: ***  GI Procedures: ***  Past Medical History:  Diagnosis Date   GERD (gastroesophageal reflux disease)    Hemorrhoid    History of hiatal hernia    History of kidney stones    IBS (irritable bowel syndrome)    PONV (postoperative nausea and vomiting)    Rosacea     Past Surgical History:  Procedure Laterality Date   CESAREAN SECTION  03/02/1991   COLONOSCOPY     x2   ESOPHAGOGASTRODUODENOSCOPY (EGD) WITH PROPOFOL N/A 08/10/2021   Procedure: ESOPHAGOGASTRODUODENOSCOPY (EGD) WITH PROPOFOL;  Surgeon: Toney Reil, MD;  Location: [facility];  Service: Gastroenterology;  Laterality: N/A;   LAPAROSCOPIC HYSTERECTOMY  03/01/2000   LASIK     tummy tuck     XI ROBOTIC ASSISTED PARAESOPHAGEAL HERNIA REPAIR N/A 10/01/2021   Procedure: XI ROBOTIC ASSISTED PARAESOPHAGEAL HERNIA REPAIR, RNFA to assist;  Surgeon: Leafy Ro, MD;  Location: [facility];  Service: General;  Laterality: N/A;    Prior to Admission medications   Medication Sig Start Date End Date Taking? Authorizing Provider  doxycycline (VIBRAMYCIN) 100 MG capsule Take 1 capsule (100 mg total) by mouth 2 (two) times daily. 01/07/22  Yes Simmons-Robinson, Makiera, MD  escitalopram (LEXAPRO) 10 MG tablet Take 1 tablet (10 mg total) by mouth at bedtime. 11/24/21  Yes Dominic, Courtney Heys, CNM  omeprazole (PRILOSEC) 20 MG capsule Take 1 capsule (20 mg total) by mouth daily. 08/12/22  Yes  Simmons-Robinson, Makiera, MD    Family History  Problem Relation Age of Onset   Breast cancer Mother      Social History   Tobacco Use   Smoking status: Never   Smokeless tobacco: Never  Vaping Use   Vaping status: Never Used  Substance Use Topics   Alcohol use: No   Drug use: No    Allergies as of 09/27/2022 - Review Complete 09/27/2022  Allergen Reaction Noted   Codeine Nausea And Vomiting 09/05/2013    Review of Systems:    All systems reviewed and negative except where noted in HPI.   Physical Exam:  BP 115/71 (BP Location: Left Arm, Patient Position: Sitting, Cuff Size: Normal)   Pulse 71   Temp 98.2 F (36.8 C) (Oral)   Ht 5\' 1"  (1.549 m)   Wt 133 lb 4 oz (60.4 kg)   BMI 25.18 kg/m  No LMP recorded. Patient has had a hysterectomy.  General:   Alert,  Well-developed, well-nourished, pleasant and cooperative in NAD Head:  Normocephalic and atraumatic. Eyes:  Sclera clear, no icterus.   Conjunctiva pink. Ears:  Normal auditory acuity. Nose:  No deformity, discharge, or lesions. Mouth:  No deformity or lesions,oropharynx pink & moist. Neck:  Supple; no masses or thyromegaly. Lungs:  Respirations even and unlabored.  Clear throughout to auscultation.   No wheezes, crackles, or rhonchi. No acute distress. Heart:  Regular rate and rhythm; no murmurs, clicks, rubs, or gallops. Abdomen:  Normal bowel sounds. Soft, non-tender and non-distended without masses, hepatosplenomegaly or hernias noted.  No guarding or rebound tenderness.   Rectal: Not performed Msk:  Symmetrical without gross deformities. Good, equal movement & strength bilaterally. Pulses:  Normal pulses noted. Extremities:  No clubbing or edema.  No cyanosis. Neurologic:  Alert and oriented x3;  grossly normal neurologically. Skin:  Intact without significant lesions or rashes. No jaundice. Lymph Nodes:  No significant cervical adenopathy. Psych:  Alert and cooperative. Normal mood and  affect.  Imaging Studies: ***  Assessment and Plan:   CHRISTIE MENDELL is a 62 y.o. female with ***   Follow up in ***   [name], MD

## 2022-09-30 ENCOUNTER — Ambulatory Visit
Admission: RE | Admit: 2022-09-30 | Discharge: 2022-09-30 | Disposition: A | Discharge status: Home / Self Care | Payer: BC Managed Care – PPO | Source: Ambulatory Visit | Attending: Gastroenterology | Admitting: Gastroenterology

## 2022-09-30 ENCOUNTER — Ambulatory Visit: Payer: BC Managed Care – PPO | Admitting: Registered Nurse

## 2022-09-30 ENCOUNTER — Encounter
Admission: RE | Disposition: A | Discharge status: Home / Self Care | Payer: Self-pay | Source: Ambulatory Visit | Attending: Gastroenterology

## 2022-09-30 ENCOUNTER — Encounter: Payer: Self-pay | Admitting: Gastroenterology

## 2022-09-30 DIAGNOSIS — R1314 Dysphagia, pharyngoesophageal phase: Secondary | ICD-10-CM | POA: Insufficient documentation

## 2022-09-30 DIAGNOSIS — K589 Irritable bowel syndrome without diarrhea: Secondary | ICD-10-CM | POA: Insufficient documentation

## 2022-09-30 DIAGNOSIS — R1319 Other dysphagia: Secondary | ICD-10-CM | POA: Diagnosis not present

## 2022-09-30 DIAGNOSIS — K2100 Gastro-esophageal reflux disease with esophagitis, without bleeding: Secondary | ICD-10-CM | POA: Diagnosis not present

## 2022-09-30 DIAGNOSIS — X58XXXA Exposure to other specified factors, initial encounter: Secondary | ICD-10-CM | POA: Diagnosis not present

## 2022-09-30 DIAGNOSIS — T364X5A Adverse effect of tetracyclines, initial encounter: Secondary | ICD-10-CM | POA: Diagnosis not present

## 2022-09-30 DIAGNOSIS — R1013 Epigastric pain: Secondary | ICD-10-CM | POA: Diagnosis not present

## 2022-09-30 DIAGNOSIS — K2970 Gastritis, unspecified, without bleeding: Secondary | ICD-10-CM | POA: Insufficient documentation

## 2022-09-30 DIAGNOSIS — K3189 Other diseases of stomach and duodenum: Secondary | ICD-10-CM | POA: Diagnosis not present

## 2022-09-30 DIAGNOSIS — K2210 Ulcer of esophagus without bleeding: Secondary | ICD-10-CM | POA: Diagnosis not present

## 2022-09-30 DIAGNOSIS — K219 Gastro-esophageal reflux disease without esophagitis: Secondary | ICD-10-CM

## 2022-09-30 DIAGNOSIS — K259 Gastric ulcer, unspecified as acute or chronic, without hemorrhage or perforation: Secondary | ICD-10-CM

## 2022-09-30 HISTORY — PX: ESOPHAGOGASTRODUODENOSCOPY (EGD) WITH PROPOFOL: SHX5813

## 2022-09-30 HISTORY — PX: BIOPSY: SHX5522

## 2022-09-30 SURGERY — ESOPHAGOGASTRODUODENOSCOPY (EGD) WITH PROPOFOL
Anesthesia: General

## 2022-09-30 MED ORDER — LIDOCAINE HCL (PF) 2 % IJ SOLN
INTRAMUSCULAR | Status: AC
  Filled 2022-09-30: qty 5

## 2022-09-30 MED ORDER — PROPOFOL 10 MG/ML IV BOLUS
INTRAVENOUS | Status: DC | PRN
  Administered 2022-09-30: 100 mg via INTRAVENOUS
  Administered 2022-09-30: 50 mg via INTRAVENOUS

## 2022-09-30 MED ORDER — SODIUM CHLORIDE 0.9 % IV SOLN: INTRAVENOUS | Status: DC

## 2022-09-30 MED ORDER — PANTOPRAZOLE SODIUM 40 MG PO TBEC: 40.0000 mg | DELAYED_RELEASE_TABLET | Freq: Two times a day (BID) | ORAL | 2 refills | Status: DC

## 2022-09-30 MED ORDER — LIDOCAINE HCL (CARDIAC) PF 100 MG/5ML IV SOSY
PREFILLED_SYRINGE | INTRAVENOUS | Status: DC | PRN
  Administered 2022-09-30: 100 mg via INTRAVENOUS

## 2022-09-30 MED ORDER — PROPOFOL 10 MG/ML IV BOLUS
INTRAVENOUS | Status: AC
  Filled 2022-09-30: qty 20

## 2022-09-30 NOTE — Anesthesia Preprocedure Evaluation (Signed)
Anesthesia Evaluation  Patient identified by MRN, date of birth, ID band Patient awake    Reviewed: Allergy & Precautions, H&P , NPO status , Patient's Chart, lab work & pertinent test results, reviewed documented beta blocker date and time   History of Anesthesia Complications (+) PONV and history of anesthetic complications  Airway Mallampati: II   Neck ROM: full    Dental  (+) Poor Dentition   Pulmonary neg pulmonary ROS   Pulmonary exam normal        Cardiovascular negative cardio ROS Normal cardiovascular exam Rhythm:regular Rate:Normal     Neuro/Psych  Neuromuscular disease  negative psych ROS   GI/Hepatic Neg liver ROS, hiatal hernia,GERD  Medicated,,  Endo/Other  negative endocrine ROS    Renal/GU negative Renal ROS  negative genitourinary   Musculoskeletal   Abdominal   Peds  Hematology negative hematology ROS (+)   Anesthesia Other Findings Past Medical History: No date: GERD (gastroesophageal reflux disease) No date: Hemorrhoid No date: History of hiatal hernia No date: History of kidney stones No date: IBS (irritable bowel syndrome) No date: PONV (postoperative nausea and vomiting) No date: Rosacea Past Surgical History: No date: ABDOMINAL HYSTERECTOMY 03/02/1991: CESAREAN SECTION No date: COLONOSCOPY     Comment:  x2 08/10/2021: ESOPHAGOGASTRODUODENOSCOPY (EGD) WITH PROPOFOL; N/A     Comment:  Procedure: ESOPHAGOGASTRODUODENOSCOPY (EGD) WITH               PROPOFOL;  Surgeon: Toney Reil, MD;  Location:               [facility];  Service: Gastroenterology;  Laterality:               N/A; 03/01/2000: LAPAROSCOPIC HYSTERECTOMY No date: LASIK No date: tummy tuck 10/01/2021: XI ROBOTIC ASSISTED PARAESOPHAGEAL HERNIA REPAIR; N/A     Comment:  Procedure: XI ROBOTIC ASSISTED PARAESOPHAGEAL HERNIA               REPAIR, RNFA to assist;  Surgeon: Leafy Ro, MD;                 Location: [facility];  Service: General;  Laterality: N/A; BMI    Body Mass Index: 24.98 kg/m     Reproductive/Obstetrics negative OB ROS                             Anesthesia Physical Anesthesia Plan  ASA: 2  Anesthesia Plan: General   Post-op Pain Management:    Induction:   PONV Risk Score and Plan:   Airway Management Planned:   Additional Equipment:   Intra-op Plan:   Post-operative Plan:   Informed Consent: I have reviewed the patients History and Physical, chart, labs and discussed the procedure including the risks, benefits and alternatives for the proposed anesthesia with the patient or authorized representative who has indicated his/her understanding and acceptance.     Dental Advisory Given  Plan Discussed with: CRNA  Anesthesia Plan Comments:        Anesthesia Quick Evaluation

## 2022-09-30 NOTE — H&P (Signed)
[name], MD [address]  [address]  [place], [place] [address]  Main: [phone]  Fax: [phone] Pager: [phone]  Primary Care Physician:  Ronnald Ramp, MD Primary Gastroenterologist:  Dr. [name]  Pre-Procedure History & Physical: HPI:  Sharon Branch is a 62 y.o. female is here for an endoscopy.   Past Medical History:  Diagnosis Date   GERD (gastroesophageal reflux disease)    Hemorrhoid    History of hiatal hernia    History of kidney stones    IBS (irritable bowel syndrome)    PONV (postoperative nausea and vomiting)    Rosacea     Past Surgical History:  Procedure Laterality Date   ABDOMINAL HYSTERECTOMY     CESAREAN SECTION  03/02/1991   COLONOSCOPY     x2   ESOPHAGOGASTRODUODENOSCOPY (EGD) WITH PROPOFOL N/A 08/10/2021   Procedure: ESOPHAGOGASTRODUODENOSCOPY (EGD) WITH PROPOFOL;  Surgeon: Toney Reil, MD;  Location: [facility];  Service: Gastroenterology;  Laterality: N/A;   LAPAROSCOPIC HYSTERECTOMY  03/01/2000   LASIK     tummy tuck     XI ROBOTIC ASSISTED PARAESOPHAGEAL HERNIA REPAIR N/A 10/01/2021   Procedure: XI ROBOTIC ASSISTED PARAESOPHAGEAL HERNIA REPAIR, RNFA to assist;  Surgeon: Leafy Ro, MD;  Location: [facility];  Service: General;  Laterality: N/A;    Prior to Admission medications   Medication Sig Start Date End Date Taking? Authorizing Provider  escitalopram (LEXAPRO) 10 MG tablet Take 1 tablet (10 mg total) by mouth at bedtime. 11/24/21  Yes Dominic, Courtney Heys, CNM  omeprazole (PRILOSEC) 20 MG capsule Take 1 capsule (20 mg total) by mouth daily. 08/12/22  Yes Simmons-Robinson, Makiera, MD  doxycycline (VIBRAMYCIN) 100 MG capsule Take 1 capsule (100 mg total) by mouth 2 (two) times daily. 01/07/22   Simmons-Robinson, Tawanna Cooler, MD    Allergies as of 09/27/2022 - Review Complete 09/27/2022  Allergen Reaction Noted   Codeine Nausea And Vomiting 09/05/2013    Family History  Problem  Relation Age of Onset   Breast cancer Mother     Social History   Socioeconomic History   Marital status: Legally Separated    Spouse name: Not on file   Number of children: Not on file   Years of education: Not on file   Highest education level: Not on file  Occupational History   Not on file  Tobacco Use   Smoking status: Never   Smokeless tobacco: Never  Vaping Use   Vaping status: Never Used  Substance and Sexual Activity   Alcohol use: No   Drug use: No   Sexual activity: Not Currently  Other Topics Concern   Not on file  Social History Narrative   Not on file   Social Determinants of Health   Financial Resource Strain: Not on file  Food Insecurity: Not on file  Transportation Needs: Not on file  Physical Activity: Not on file  Stress: Not on file  Social Connections: Not on file  Intimate Partner Violence: Not on file    Review of Systems: See HPI, otherwise negative ROS  Physical Exam: BP 132/85   Pulse (!) 59   Temp (!) 96.8 F (36 C) (Temporal)   Resp 16   Ht 5\' 1"  (1.549 m)   Wt 60 kg   SpO2 100%   BMI 24.98 kg/m  General:   Alert,  pleasant and cooperative in NAD Head:  Normocephalic and atraumatic. Neck:  Supple; no masses or thyromegaly. Lungs:  Clear throughout  to auscultation.    Heart:  Regular rate and rhythm. Abdomen:  Soft, nontender and nondistended. Normal bowel sounds, without guarding, and without rebound.   Neurologic:  Alert and  oriented x4;  grossly normal neurologically.  Impression/Plan: Sharon Branch is here for an endoscopy to be performed for  Epigastric pain, history of hiatal hernia, s/p repair, recurrence of reflux, dysphagia  Risks, benefits, limitations, and alternatives regarding  endoscopy have been reviewed with the patient.  Questions have been answered.  All parties agreeable.   Lannette Donath, MD  09/30/2022, 9:52 AM

## 2022-09-30 NOTE — Transfer of Care (Signed)
Immediate Anesthesia Transfer of Care Note  Patient: Sharon Branch  Procedure(s) Performed: ESOPHAGOGASTRODUODENOSCOPY (EGD) WITH PROPOFOL  Patient Location: PACU and Endoscopy Unit  Anesthesia Type:General  Level of Consciousness: drowsy and patient cooperative  Airway & Oxygen Therapy: Patient Spontanous Breathing and Patient connected to face mask oxygen  Post-op Assessment: Report given to RN and Patient moving all extremities X 4  Post vital signs: Reviewed and stable  Last Vitals:  Vitals Value Taken Time  BP 98/44 09/30/22 1020  Temp    Pulse 56 09/30/22 1021  Resp 18 09/30/22 1021  SpO2 98 % 09/30/22 1021  Vitals shown include unfiled device data.  Last Pain:  Vitals:   09/30/22 0846  TempSrc: Temporal  PainSc: 0-No pain         Complications: No notable events documented.

## 2022-09-30 NOTE — Op Note (Addendum)
[facility] Gastroenterology Patient Name: Sharon Branch Procedure Date: 09/30/2022 9:53 AM MRN: [id number] Account #: [id number] Date of Birth: Nov 26, 1960 Admit Type: Outpatient Age: 62 Room: [facility] ENDO ROOM 3 Gender: Female Note Status: Finalized Instrument Name: Upper Endoscope [id number] Procedure:             Upper GI endoscopy Indications:           Epigastric abdominal pain, Esophageal dysphagia,                         Esophageal reflux symptoms that persist despite                         appropriate therapy Providers:             Toney Reil MD, MD Referring MD:          Nicki Guadalajara (Referring MD) Medicines:             General Anesthesia Complications:         No immediate complications. Estimated blood loss: None. Procedure:             Pre-Anesthesia Assessment:                        - Prior to the procedure, a History and Physical was                         performed, and patient medications and allergies were                         reviewed. The patient is competent. The risks and                         benefits of the procedure and the sedation options and                         risks were discussed with the patient. All questions                         were answered and informed consent was obtained.                         Patient identification and proposed procedure were                         verified by the physician, the nurse, the                         anesthesiologist, the anesthetist and the technician                         in the pre-procedure area in the procedure room in the                         endoscopy suite. Mental Status Examination: alert and                         oriented. Airway Examination: normal oropharyngeal  airway and neck mobility. Respiratory Examination:                         clear to auscultation. CV Examination: normal.                         Prophylactic Antibiotics: The  patient does not require                         prophylactic antibiotics. Prior Anticoagulants: The                         patient has taken no anticoagulant or antiplatelet                         agents. ASA Grade Assessment: II - A patient with mild                         systemic disease. After reviewing the risks and                         benefits, the patient was deemed in satisfactory                         condition to undergo the procedure. The anesthesia                         plan was to use general anesthesia. Immediately prior                         to administration of medications, the patient was                         re-assessed for adequacy to receive sedatives. The                         heart rate, respiratory rate, oxygen saturations,                         blood pressure, adequacy of pulmonary ventilation, and                         response to care were monitored throughout the                         procedure. The physical status of the patient was                         re-assessed after the procedure.                        After obtaining informed consent, the endoscope was                         passed under direct vision. Throughout the procedure,                         the patient's blood pressure, pulse, and oxygen  saturations were monitored continuously. The Endoscope                         was introduced through the mouth, and advanced to the                         second part of duodenum. The upper GI endoscopy was                         accomplished without difficulty. The patient tolerated                         the procedure well. Findings:      The duodenal bulb and second portion of the duodenum were normal.      Multiple dispersed long linear erosions with no bleeding and no stigmata       of recent bleeding were found in the gastric antrum. Biopsies were taken       with a cold forceps for histology.       The gastric body and incisura were normal. Biopsies were taken with a       cold forceps for histology.      Evidence of a Nissen fundoplication was found in the cardia. The wrap       appeared intact. This was traversed.      One superficial esophageal ulcer with no bleeding and no stigmata of       recent bleeding was found 35 cm from the incisors. The lesion was 5 mm       in largest dimension. Most likely secondary to long term intake of oral       doxycycline      LA Grade A (one or more mucosal breaks less than 5 mm, not extending       between tops of 2 mucosal folds) esophagitis with no bleeding was found       in the lower third of the esophagus. Impression:            - Normal duodenal bulb and second portion of the                         duodenum.                        - Erosive gastropathy with no bleeding and no stigmata                         of recent bleeding. Biopsied.                        - Normal gastric body and incisura. Biopsied.                        - A Nissen fundoplication was found. The wrap appears                         intact.                        - Doxycycline induced Esophageal ulcer with no  bleeding and no stigmata of recent bleeding.                        - Pill induced esophagitis with no bleeding. Recommendation:        - Discharge patient to home (with escort).                        - Resume previous diet today.                        - Continue present medications.                        - Await pathology results.                        - Stop doxycycline and discuss with PCP about                         alternate options for treatment of rosasea                        - Use Protonix (pantoprazole) 40 mg PO BID for 3                         months. Procedure Code(s):     --- Professional ---                        [phone], Esophagogastroduodenoscopy, flexible,                         transoral; with biopsy, single  or multiple Diagnosis Code(s):     --- Professional ---                        K31.89, Other diseases of stomach and duodenum                        Z98.890, Other specified postprocedural states                        K22.10, Ulcer of esophagus without bleeding                        K21.00, Gastro-esophageal reflux disease with                         esophagitis, without bleeding                        R10.13, Epigastric pain                        R13.14, Dysphagia, pharyngoesophageal phase CPT copyright 2022 [employer]. All rights reserved. The codes documented in this report are preliminary and upon coder review may  be revised to meet current compliance requirements. Dr. Libby Maw Toney Reil MD, MD 09/30/2022 10:21:10 AM This report has been signed electronically. Number of Addenda: 0 Note Initiated On: 09/30/2022 9:53 AM Estimated Blood Loss:  Estimated blood loss: none.      [facility]

## 2022-09-30 NOTE — Anesthesia Procedure Notes (Addendum)
Procedure Name: General with mask airway Date/Time: 09/30/2022 10:04 AM  Performed by: Lily Lovings, CRNAPre-anesthesia Checklist: Patient identified, Emergency Drugs available, Suction available, Patient being monitored and Timeout performed Patient Re-evaluated:Patient Re-evaluated prior to induction Oxygen Delivery Method: Simple face mask Preoxygenation: Pre-oxygenation with 100% oxygen Induction Type: IV induction

## 2022-10-01 ENCOUNTER — Encounter: Payer: Self-pay | Admitting: Gastroenterology

## 2022-10-03 ENCOUNTER — Encounter: Payer: Self-pay | Admitting: Gastroenterology

## 2022-10-04 NOTE — Anesthesia Postprocedure Evaluation (Signed)
Anesthesia Post Note  Patient: Sharon Branch  Procedure(s) Performed: ESOPHAGOGASTRODUODENOSCOPY (EGD) WITH PROPOFOL BIOPSY  Patient location during evaluation: PACU Anesthesia Type: General Level of consciousness: awake and alert Pain management: pain level controlled Vital Signs Assessment: post-procedure vital signs reviewed and stable Respiratory status: spontaneous breathing, nonlabored ventilation, respiratory function stable and patient connected to nasal cannula oxygen Cardiovascular status: blood pressure returned to baseline and stable Postop Assessment: no apparent nausea or vomiting Anesthetic complications: no   No notable events documented.   Last Vitals:  Vitals:   09/30/22 1030 09/30/22 1040  BP: 115/72 135/78  Pulse: (!) 58 (!) 59  Resp: 16 18  Temp:    SpO2: 100% 99%    Last Pain:  Vitals:   10/01/22 0732  TempSrc:   PainSc: 0-No pain                 Yevette Edwards

## 2022-10-14 ENCOUNTER — Ambulatory Visit: Payer: BC Managed Care – PPO | Admitting: Podiatry

## 2022-10-19 ENCOUNTER — Ambulatory Visit: Payer: BC Managed Care – PPO | Admitting: Podiatry

## 2022-10-19 ENCOUNTER — Encounter: Payer: Self-pay | Admitting: Podiatry

## 2022-10-19 ENCOUNTER — Encounter: Payer: Self-pay | Admitting: Family Medicine

## 2022-10-19 ENCOUNTER — Other Ambulatory Visit: Payer: Self-pay | Admitting: Family Medicine

## 2022-10-19 DIAGNOSIS — Q666 Other congenital valgus deformities of feet: Secondary | ICD-10-CM

## 2022-10-19 DIAGNOSIS — L719 Rosacea, unspecified: Secondary | ICD-10-CM

## 2022-10-19 DIAGNOSIS — M7751 Other enthesopathy of right foot: Secondary | ICD-10-CM | POA: Diagnosis not present

## 2022-10-19 MED ORDER — IVERMECTIN 1 % EX CREA
1.0000 | TOPICAL_CREAM | Freq: Every day | CUTANEOUS | 1 refills | Status: AC
Start: 2022-10-19 — End: ?

## 2022-10-19 NOTE — Progress Notes (Signed)
  Subjective:  Patient ID: Sharon Branch, female    DOB: 03/12/1960,  MRN: [id number]  Chief Complaint  Patient presents with   Foot Pain    Pt stated that her foot is doing much better she is here to pick up orthotics     62 y.o. female presents with the above complaint.  Patient presents for follow-up of the third metatarsophalangeal joint she states she is doing a lot better denies any other acute complaints.  She is here to pick up orthotics   Review of Systems: Negative except as noted in the HPI. Denies N/V/F/Ch.  Past Medical History:  Diagnosis Date   GERD (gastroesophageal reflux disease)    Hemorrhoid    History of hiatal hernia    History of kidney stones    IBS (irritable bowel syndrome)    PONV (postoperative nausea and vomiting)    Rosacea     Current Outpatient Medications:    escitalopram (LEXAPRO) 10 MG tablet, Take 1 tablet (10 mg total) by mouth at bedtime., Disp: 90 tablet, Rfl: 3  Social History   Tobacco Use  Smoking Status Never  Smokeless Tobacco Never    Allergies  Allergen Reactions   Codeine Nausea And Vomiting   Objective:  There were no vitals filed for this visit. There is no height or weight on file to calculate BMI. Constitutional Well developed. Well nourished.  Vascular Dorsalis pedis pulses palpable bilaterally. Posterior tibial pulses palpable bilaterally. Capillary refill normal to all digits.  No cyanosis or clubbing noted. Pedal hair growth normal.  Neurologic Normal speech. Oriented to person, place, and time. Epicritic sensation to light touch grossly present bilaterally.  Dermatologic Nails well groomed and normal in appearance. No open wounds. No skin lesions.  Orthopedic: No further pain on palpation of right third metatarsophalangeal joint no further pain with range of motion of the joint deep intra-articular pain noted.  No other abnormalities identified no other metatarsophalangeal joints are inflamed    Radiographs: None Assessment:  No diagnosis found. Plan:  Patient was evaluated and treated and all questions answered.  Right third MTP capsulitis -Clinically healed and doing much better.  I discussed shoe gear modification and offloading and orthotics.  She states she will wear her orthotics  Pes planovalgus -I explained to patient the etiology of pes planovalgus and relationship with Planter fasciitis and various treatment options were discussed.  Given patient foot structure in the setting of Planter fasciitis I believe patient will benefit from custom-made orthotics to help control the hindfoot motion support the arch of the foot and take the stress away from plantar fascial.  Patient agrees with the plan like to proceed with orthotics -Orthotics were dispensed and they are functioning well

## 2022-10-19 NOTE — Telephone Encounter (Signed)
Pharmacist reports insurance will cover brand name. He will change order. Co-pay $25.

## 2022-10-21 ENCOUNTER — Encounter: Payer: Self-pay | Admitting: Gastroenterology

## 2022-12-07 ENCOUNTER — Other Ambulatory Visit: Payer: Self-pay

## 2022-12-28 ENCOUNTER — Ambulatory Visit: Payer: Self-pay | Admitting: *Deleted

## 2022-12-28 ENCOUNTER — Encounter: Payer: Self-pay | Admitting: Family Medicine

## 2022-12-28 ENCOUNTER — Ambulatory Visit: Payer: BC Managed Care – PPO | Admitting: Family Medicine

## 2022-12-28 VITALS — BP 122/76 | HR 75 | Temp 98.2°F | Resp 16 | Ht 62.0 in | Wt 128.5 lb

## 2022-12-28 DIAGNOSIS — R1084 Generalized abdominal pain: Secondary | ICD-10-CM | POA: Diagnosis not present

## 2022-12-28 DIAGNOSIS — K5901 Slow transit constipation: Secondary | ICD-10-CM | POA: Insufficient documentation

## 2022-12-28 MED ORDER — SIMETHICONE 80 MG PO CHEW: 80.0000 mg | CHEWABLE_TABLET | Freq: Four times a day (QID) | ORAL | Status: DC | PRN

## 2022-12-28 MED ORDER — METOCLOPRAMIDE HCL 5 MG PO TABS: 5.0000 mg | ORAL_TABLET | Freq: Three times a day (TID) | ORAL | 0 refills | Status: DC

## 2022-12-28 MED ORDER — MAGNESIUM CITRATE PO SOLN: 1.0000 | Freq: Once | ORAL | Status: AC

## 2022-12-28 NOTE — Telephone Encounter (Signed)
Reason for Disposition  [1] MILD-MODERATE pain AND [2] constant AND [3] present > 2 hours  Answer Assessment - Initial Assessment Questions 1. LOCATION: "Where does it hurt?"      Below bellybutton  3. ONSET: "When did the pain begin?" (e.g., minutes, hours or days ago)      yesterday  5. PATTERN "Does the pain come and go, or is it constant?"    - If it comes and goes: "How long does it last?" "Do you have pain now?"     (Note: Comes and goes means the pain is intermittent. It goes away completely between bouts.)    - If constant: "Is it getting better, staying the same, or getting worse?"      (Note: Constant means the pain never goes away completely; most serious pain is constant and gets worse.)      Comes and goes- lasting 15 min 6. SEVERITY: "How bad is the pain?"  (e.g., Scale 1-10; mild, moderate, or severe)    - MILD (1-3): Doesn't interfere with normal activities, abdomen soft and not tender to touch.     - MODERATE (4-7): Interferes with normal activities or awakens from sleep, abdomen tender to touch.     - SEVERE (8-10): Excruciating pain, doubled over, unable to do any normal activities.       Moderate  8. CAUSE: "What do you think is causing the stomach pain?"     unknown 9. RELIEVING/AGGRAVATING FACTORS: "What makes it better or worse?" (e.g., antacids, bending or twisting motion, bowel movement)     Patient has tried enema- BM seemed to help 10. OTHER SYMPTOMS: "Do you have any other symptoms?" (e.g., back pain, diarrhea, fever, urination pain, vomiting)       vomiting  Protocols used: Abdominal Pain - Female-A-AH

## 2022-12-28 NOTE — Assessment & Plan Note (Signed)
Acute <24 hrs of symptoms; no known sick contacts or recent travel Poor PO intake given ongoing N/V now with dry heaves Recommend abdominal lab panel and treatment for constipation and bloating/dysmotility Continue to monitor Consider use of KUB or abdominal US; no hx of gallbladder concerns, hx of hysterectomy, no recent PMB; known GERD on PPI

## 2022-12-28 NOTE — Telephone Encounter (Signed)
  Chief Complaint: abdominal pain Symptoms: abdominal pain- below bellybutton- moderate- lasting 1 minute- comes and goes Frequency: started yesterday  Disposition: [] ED /[] Urgent Care (no appt availability in office) / [x] Appointment(In office/virtual)/ []  [place]/ [] Home Care/ [] Refused Recommended Disposition /[] [place] [facility]/ []  Follow-up with PCP Additional Notes: Agent has scheduled appointment- patient advised keep appointment

## 2022-12-28 NOTE — Progress Notes (Signed)
Established patient visit   Patient: Sharon Branch   DOB: 04-Nov-1960   62 y.o. Female  MRN: [id number] Visit Date: 12/28/2022  Today's healthcare provider: Jacky Kindle, FNP  Introduced to nurse practitioner role and practice setting.  All questions answered.  Discussed provider/patient relationship and expectations.  Subjective    Abdominal Pain   HPI     Abdominal Pain    Additional comments: Since yesterday it comes in waves like labor pains very severe and in a lot of pain , makes pt vomit lasted all thru the night got no sleep      Last edited by Clois Comber on 12/28/2022  1:10 PM.      Medications: Outpatient Medications Prior to Visit  Medication Sig   escitalopram (LEXAPRO) 10 MG tablet TAKE 1 TABLET BY MOUTH EVERYDAY AT BEDTIME   Ivermectin (SOOLANTRA) 1 % CREA Apply 1 Application topically daily.   pantoprazole (PROTONIX) 40 MG tablet Take 40 mg by mouth 2 (two) times daily.   No facility-administered medications prior to visit.    Review of Systems  Gastrointestinal:  Positive for abdominal pain.     Objective    BP 122/76 (BP Location: Left Arm, Patient Position: Sitting, Cuff Size: Normal)   Pulse 75   Temp 98.2 F (36.8 C)   Resp 16   Ht 5\' 2"  (1.575 m)   Wt 128 lb 8 oz (58.3 kg)   SpO2 98%   BMI 23.50 kg/m   Physical Exam Vitals and nursing note reviewed.  Constitutional:      General: She is not in acute distress.    Appearance: Normal appearance. She is well-developed and normal weight. She is not ill-appearing, toxic-appearing or diaphoretic.  HENT:     Head: Normocephalic and atraumatic.  Cardiovascular:     Rate and Rhythm: Normal rate and regular rhythm.     Pulses: Normal pulses.     Heart sounds: Normal heart sounds. No murmur heard.    No friction rub. No gallop.  Pulmonary:     Effort: Pulmonary effort is normal. No respiratory distress.     Breath sounds: Normal breath sounds. No stridor. No wheezing, rhonchi or  rales.  Chest:     Chest wall: No tenderness.  Abdominal:     General: Bowel sounds are decreased. There is distension.     Palpations: Abdomen is soft.     Tenderness: There is abdominal tenderness. There is no guarding or rebound. Negative signs include Murphy's sign, Rovsing's sign, McBurney's sign, psoas sign and obturator sign.     Hernia: No hernia is present.       Comments: C/o bloating; tinkling bowel sounds- pt defers KUB or [place] at this time   Musculoskeletal:        General: No swelling, tenderness, deformity or signs of injury. Normal range of motion.     Right lower leg: No edema.     Left lower leg: No edema.  Skin:    General: Skin is warm and dry.     Capillary Refill: Capillary refill takes less than 2 seconds.     Coloration: Skin is not jaundiced or pale.     Findings: No bruising, erythema, lesion or rash.  Neurological:     General: No focal deficit present.     Mental Status: She is alert and oriented to person, place, and time. Mental status is at baseline.     Cranial Nerves: No cranial nerve deficit.  Sensory: No sensory deficit.     Motor: No weakness.     Coordination: Coordination normal.  Psychiatric:        Mood and Affect: Mood normal.        Behavior: Behavior normal.        Thought Content: Thought content normal.        Judgment: Judgment normal.     No results found for any visits on 12/28/22.  Assessment & Plan     Problem List Items Addressed This Visit       Digestive   Slow transit constipation    Acute <24 hrs of symptoms; no known sick contacts or recent travel Poor PO intake given ongoing N/V now with dry heaves Recommend abdominal lab panel and treatment for constipation and bloating/dysmotility Continue to monitor Consider use of KUB or abdominal US; no hx of gallbladder concerns, hx of hysterectomy, no recent PMB; known GERD on PPI      Relevant Medications   metoCLOPramide (REGLAN) 5 MG tablet     Other    Generalized abdominal pain - Primary   Relevant Medications   metoCLOPramide (REGLAN) 5 MG tablet   Other Relevant Orders   Lipase   Amylase   CBC with Differential/Platelet   Comprehensive Metabolic Panel (CMET)    Return if symptoms worsen or fail to improve.      Leilani Merl, FNP, have reviewed all documentation for this visit. The documentation on 12/28/22 for the exam, diagnosis, procedures, and orders are all accurate and complete.  Jacky Kindle, FNP  [facility] [phone] (phone) [phone] (fax)  [facility]

## 2022-12-29 LAB — CBC WITH DIFFERENTIAL/PLATELET
Basophils Absolute: 0 10*3/uL (ref 0.0–0.2)
Basos: 0 %
EOS (ABSOLUTE): 0 10*3/uL (ref 0.0–0.4)
Eos: 0 %
Hematocrit: 40 % (ref 34.0–46.6)
Hemoglobin: 12.9 g/dL (ref 11.1–15.9)
Immature Grans (Abs): 0 10*3/uL (ref 0.0–0.1)
Immature Granulocytes: 0 %
Lymphocytes Absolute: 0.7 10*3/uL (ref 0.7–3.1)
Lymphs: 7 %
MCH: 28 pg (ref 26.6–33.0)
MCHC: 32.3 g/dL (ref 31.5–35.7)
MCV: 87 fL (ref 79–97)
Monocytes Absolute: 0.7 10*3/uL (ref 0.1–0.9)
Monocytes: 8 %
Neutrophils Absolute: 8.3 10*3/uL — ABNORMAL HIGH (ref 1.4–7.0)
Neutrophils: 85 %
Platelets: 250 10*3/uL (ref 150–450)
RBC: 4.6 x10E6/uL (ref 3.77–5.28)
RDW: 13.2 % (ref 11.7–15.4)
WBC: 9.8 10*3/uL (ref 3.4–10.8)

## 2022-12-29 LAB — COMPREHENSIVE METABOLIC PANEL
ALT: 9 [IU]/L (ref 0–32)
AST: 17 [IU]/L (ref 0–40)
Albumin: 4 g/dL (ref 3.9–4.9)
Alkaline Phosphatase: 189 [IU]/L — ABNORMAL HIGH (ref 44–121)
BUN/Creatinine Ratio: 16 (ref 12–28)
BUN: 10 mg/dL (ref 8–27)
Bilirubin Total: 0.6 mg/dL (ref 0.0–1.2)
CO2: 22 mmol/L (ref 20–29)
Calcium: 8.8 mg/dL (ref 8.7–10.3)
Chloride: 101 mmol/L (ref 96–106)
Creatinine, Ser: 0.64 mg/dL (ref 0.57–1.00)
Globulin, Total: 2.2 g/dL (ref 1.5–4.5)
Glucose: 105 mg/dL — ABNORMAL HIGH (ref 70–99)
Potassium: 4 mmol/L (ref 3.5–5.2)
Sodium: 139 mmol/L (ref 134–144)
Total Protein: 6.2 g/dL (ref 6.0–8.5)
eGFR: 100 mL/min/{1.73_m2} (ref >59)

## 2022-12-29 LAB — AMYLASE: Amylase: 52 U/L (ref 31–110)

## 2022-12-29 LAB — LIPASE: Lipase: 26 U/L (ref 14–72)

## 2023-01-04 NOTE — Progress Notes (Unsigned)
      Established patient visit   Patient: Sharon Branch   DOB: 1961/02/22   62 y.o. Female  MRN: [id number] Visit Date: 01/05/2023  Today's healthcare provider: Ronnald Ramp, MD   No chief complaint on file.  Subjective       Discussed the use of AI scribe software for clinical note transcription with the patient, who gave verbal consent to proceed.  History of Present Illness             Past Medical History:  Diagnosis Date   GERD (gastroesophageal reflux disease)    Hemorrhoid    History of hiatal hernia    History of kidney stones    IBS (irritable bowel syndrome)    PONV (postoperative nausea and vomiting)    Rosacea     Medications: Outpatient Medications Prior to Visit  Medication Sig   escitalopram (LEXAPRO) 10 MG tablet TAKE 1 TABLET BY MOUTH EVERYDAY AT BEDTIME   Ivermectin (SOOLANTRA) 1 % CREA Apply 1 Application topically daily.   metoCLOPramide (REGLAN) 5 MG tablet Take 1 tablet (5 mg total) by mouth 4 (four) times daily -  before meals and at bedtime.   pantoprazole (PROTONIX) 40 MG tablet Take 40 mg by mouth 2 (two) times daily.   simethicone (MYLICON) 80 MG chewable tablet Chew 1 tablet (80 mg total) by mouth every 6 (six) hours as needed for flatulence.   No facility-administered medications prior to visit.    Review of Systems  {Insert previous labs (optional):23779} {See past labs  Heme  Chem  Endocrine  Serology  Results Review (optional):1}   Objective    There were no vitals taken for this visit. {Insert last BP/Wt (optional):23777}{See vitals history (optional):1}      Physical Exam  ***  No results found for any visits on 01/05/23.  Assessment & Plan     Problem List Items Addressed This Visit   None   Assessment and Plan              No follow-ups on file.         Ronnald Ramp, MD  [facility] [phone] (phone) [phone] (fax)  [facility]

## 2023-01-05 ENCOUNTER — Ambulatory Visit: Payer: BC Managed Care – PPO | Admitting: Family Medicine

## 2023-01-05 ENCOUNTER — Encounter: Payer: Self-pay | Admitting: Family Medicine

## 2023-01-05 VITALS — BP 104/76 | HR 65 | Ht 61.0 in | Wt 127.0 lb

## 2023-01-05 DIAGNOSIS — R1032 Left lower quadrant pain: Secondary | ICD-10-CM | POA: Diagnosis not present

## 2023-01-05 NOTE — Assessment & Plan Note (Addendum)
Improved but persistent left-sided abdominal soreness. No urinary symptoms, vaginal discharge, or abnormal bleeding. No blood in stool. Normal CMP and CBC. Possible history of constipation and diverticulosis. History of peptic ulcer disease. -Continue Protonix 40mg  daily. -Encourage regular bowel movements and hydration. -Consider heat therapy and massage for residual soreness. -recommend f/u w/ gastro if symptoms worsen

## 2023-01-08 ENCOUNTER — Other Ambulatory Visit: Payer: Self-pay | Admitting: Gastroenterology

## 2023-01-10 MED ORDER — OMEPRAZOLE 20 MG PO CPDR: 20.0000 mg | DELAYED_RELEASE_CAPSULE | Freq: Every day | ORAL | 0 refills | Status: DC

## 2023-01-10 NOTE — Telephone Encounter (Signed)
Last office visit 09/27/2022 Dysphagia  Plan: Recommend EGD for further evaluation Continue omeprazole 20 mg daily before dinner EGD 09/30/2022 Use Pantoprazole 40mg  BID for 3 months  No appointment is schedule  She has used the pantoprazole BID for 3 months what do you recommend now

## 2023-02-01 NOTE — Progress Notes (Unsigned)
Complete physical exam   Patient: Sharon Branch   DOB: [birth year]   62 y.o. Female  MRN: [id number] Visit Date: 02/02/2023  Today's healthcare provider: Ronnald Ramp, MD   No chief complaint on file.  Subjective    Sharon Branch is a 62 y.o. female who presents today for a complete physical exam.   She reports consuming a {diet types:17450} diet.   {Exercise:19826}   She generally feels {well/fairly well/poorly:18703}.   She reports sleeping {well/fairly well/poorly:18703}.    She {does/does not:200015} have additional problems to discuss today.   Discussed the use of AI scribe software for clinical note transcription with the patient, who gave verbal consent to proceed.  History of Present Illness             Past Medical History:  Diagnosis Date   GERD (gastroesophageal reflux disease)    Hemorrhoid    History of hiatal hernia    History of kidney stones    IBS (irritable bowel syndrome)    PONV (postoperative nausea and vomiting)    Rosacea    Past Surgical History:  Procedure Laterality Date   ABDOMINAL HYSTERECTOMY     BIOPSY  09/30/2022   Procedure: BIOPSY;  Surgeon: Toney Reil, MD;  Location: [facility];  Service: Gastroenterology;;   CESAREAN SECTION  03/02/1991   COLONOSCOPY     x2   ESOPHAGOGASTRODUODENOSCOPY (EGD) WITH PROPOFOL N/A 08/10/2021   Procedure: ESOPHAGOGASTRODUODENOSCOPY (EGD) WITH PROPOFOL;  Surgeon: Toney Reil, MD;  Location: [facility] ENDOSCOPY;  Service: Gastroenterology;  Laterality: N/A;   ESOPHAGOGASTRODUODENOSCOPY (EGD) WITH PROPOFOL N/A 09/30/2022   Procedure: ESOPHAGOGASTRODUODENOSCOPY (EGD) WITH PROPOFOL;  Surgeon: Toney Reil, MD;  Location: [facility] ENDOSCOPY;  Service: Gastroenterology;  Laterality: N/A;   LAPAROSCOPIC HYSTERECTOMY  03/01/2000   LASIK     tummy tuck     XI ROBOTIC ASSISTED PARAESOPHAGEAL HERNIA REPAIR N/A 10/01/2021   Procedure: XI ROBOTIC ASSISTED PARAESOPHAGEAL HERNIA  REPAIR, RNFA to assist;  Surgeon: Leafy Ro, MD;  Location: [facility] ORS;  Service: General;  Laterality: N/A;   Social History   Socioeconomic History   Marital status: Legally Separated    Spouse name: Not on file   Number of children: Not on file   Years of education: Not on file   Highest education level: Bachelor's degree (e.g., BA, AB, BS)  Occupational History   Not on file  Tobacco Use   Smoking status: Never   Smokeless tobacco: Never  Vaping Use   Vaping status: Never Used  Substance and Sexual Activity   Alcohol use: No   Drug use: No   Sexual activity: Not Currently  Other Topics Concern   Not on file  Social History Narrative   Not on file   Social Determinants of Health   Financial Resource Strain: Low Risk  (01/04/2023)   Overall Financial Resource Strain (CARDIA)    Difficulty of Paying Living Expenses: Not hard at all  Food Insecurity: No Food Insecurity (01/04/2023)   Hunger Vital Sign    Worried About Running Out of Food in the Last Year: Never true    Ran Out of Food in the Last Year: Never true  Transportation Needs: No Transportation Needs (01/04/2023)   PRAPARE - Administrator, Civil Service (Medical): No    Lack of Transportation (Non-Medical): No  Physical Activity: Insufficiently Active (01/04/2023)   Exercise Vital Sign    Days of Exercise per Week: 3 days  Minutes of Exercise per Session: 30 min  Stress: No Stress Concern Present (01/04/2023)   [employer] of Occupational Health - Occupational Stress Questionnaire    Feeling of Stress : Not at all  Social Connections: Socially Isolated (01/04/2023)   Social Connection and Isolation Panel [NHANES]    Frequency of Communication with Friends and Family: More than three times a week    Frequency of Social Gatherings with Friends and Family: More than three times a week    Attends Religious Services: Never    Database administrator or Organizations: No    Attends Museum/gallery exhibitions officer: Not on file    Marital Status: Separated  Intimate Partner Violence: Not on file   Family Status  Relation Name Status   Mother  Deceased   Father  Deceased  No partnership data on file   Family History  Problem Relation Age of Onset   Breast cancer Mother    Allergies  Allergen Reactions   Codeine Nausea And Vomiting     Medications: Outpatient Medications Prior to Visit  Medication Sig   escitalopram (LEXAPRO) 10 MG tablet TAKE 1 TABLET BY MOUTH EVERYDAY AT BEDTIME   Ivermectin (SOOLANTRA) 1 % CREA Apply 1 Application topically daily.   omeprazole (PRILOSEC) 20 MG capsule Take 1 capsule (20 mg total) by mouth daily.   omeprazole (PRILOSEC) 40 MG capsule Take 1 capsule (40 mg total) by mouth daily before breakfast.   No facility-administered medications prior to visit.    Review of Systems  {Insert previous labs (optional):23779} {See past labs  Heme  Chem  Endocrine  Serology  Results Review (optional):1}  Objective    There were no vitals taken for this visit. {Insert last BP/Wt (optional):23777}{See vitals history (optional):1}    Physical Exam  ***  Last depression screening scores    12/28/2022    1:13 PM 12/28/2022    1:09 PM 08/12/2022    3:25 PM  PHQ 2/9 Scores  PHQ - 2 Score 0 0 0  PHQ- 9 Score   2    Last fall risk screening    08/12/2022    3:25 PM  Fall Risk   Falls in the past year? 0  Number falls in past yr: 0  Injury with Fall? 0  Risk for fall due to : No Fall Risks  Follow up Falls evaluation completed    Last Audit-C alcohol use screening    01/04/2023    7:21 PM  Alcohol Use Disorder Test (AUDIT)  1. How often do you have a drink containing alcohol? 1  2. How many drinks containing alcohol do you have on a typical day when you are drinking? 0  3. How often do you have six or more drinks on one occasion? 0  AUDIT-C Score 1   A score of 3 or more in women, and 4 or more in men indicates increased  risk for alcohol abuse, EXCEPT if all of the points are from question 1   No results found for any visits on 02/02/23.  Assessment & Plan    Routine Health Maintenance and Physical Exam  Immunization History  Administered Date(s) Administered   Influenza Inj Mdck Quad Pf 11/19/2021   Influenza,inj,Quad PF,6+ Mos 12/13/2017, 11/12/2020   Influenza-Unspecified 12/05/2022    Health Maintenance  Topic Date Due   DTaP/Tdap/Td (1 - Tdap) Never done   Zoster Vaccines- Shingrix (1 of 2) Never done   MAMMOGRAM  09/26/2022  COVID-19 Vaccine (1 - 2023-24 season) Never done   Colonoscopy  10/11/2023   Cervical Cancer Screening (HPV/Pap Cotest)  12/21/2024   INFLUENZA VACCINE  Completed   Hepatitis C Screening  Completed   HIV Screening  Completed   HPV VACCINES  Aged Out    Problem List Items Addressed This Visit   None   Assessment and Plan                No follow-ups on file.       Ronnald Ramp, MD  [facility] [phone] (phone) [phone] (fax)  [facility]

## 2023-02-02 ENCOUNTER — Encounter: Payer: Self-pay | Admitting: Family Medicine

## 2023-02-02 ENCOUNTER — Ambulatory Visit (INDEPENDENT_AMBULATORY_CARE_PROVIDER_SITE_OTHER): Payer: BC Managed Care – PPO | Admitting: Family Medicine

## 2023-02-02 VITALS — BP 101/61 | HR 73 | Resp 18 | Ht 61.0 in | Wt 127.8 lb

## 2023-02-02 DIAGNOSIS — Z130 Encounter for screening for diseases of the blood and blood-forming organs and certain disorders involving the immune mechanism: Secondary | ICD-10-CM

## 2023-02-02 DIAGNOSIS — Z0000 Encounter for general adult medical examination without abnormal findings: Secondary | ICD-10-CM

## 2023-02-02 DIAGNOSIS — Z131 Encounter for screening for diabetes mellitus: Secondary | ICD-10-CM

## 2023-02-02 DIAGNOSIS — E559 Vitamin D deficiency, unspecified: Secondary | ICD-10-CM | POA: Diagnosis not present

## 2023-02-02 DIAGNOSIS — R748 Abnormal levels of other serum enzymes: Secondary | ICD-10-CM

## 2023-02-02 DIAGNOSIS — Z1231 Encounter for screening mammogram for malignant neoplasm of breast: Secondary | ICD-10-CM | POA: Diagnosis not present

## 2023-02-02 DIAGNOSIS — Z13220 Encounter for screening for lipoid disorders: Secondary | ICD-10-CM

## 2023-02-02 NOTE — Assessment & Plan Note (Signed)
Chronic  Goal level >30  Last vitamin D Lab Results  Component Value Date   VD25OH 38.0 08/12/2022    Will order Vitamin D levels today

## 2023-02-02 NOTE — Assessment & Plan Note (Addendum)
[age]-year-old presenting for an annual physical examination. General health is well-managed. Discussed the importance of regular exercise and a well-balanced diet for cardiovascular health and weight management. - Order hemoglobin A1c, CBC, CMP, lipid panel, and vitamin D level - Ordered mammogram for breast cancer screening - recommended tetanus, Shingrix, and COVID vaccines - Recommend 150 minutes per week of moderate to vigorous intensity exercise - Recommend a well-balanced diet for healthy weight management

## 2023-02-02 NOTE — Patient Instructions (Addendum)
It was a pleasure to see you today!  Thank you for choosing [facility] for your primary care.   Today you were seen for your annual physical  Please review the attached information regarding helpful preventive health topics.   To keep you healthy, please keep in mind the following health maintenance items that you are due for:   1.Shingrix  2. Tetanus   I have ordered a mammogram for your breast cancer screening   [facility] [address] [place],  [place]  [address] Main: [phone]   Please make sure to schedule your next annual physical for one year from today.   Best Wishes,   Dr. Roxan Hockey

## 2023-02-03 LAB — CMP14+EGFR
ALT: 11 [IU]/L (ref 0–32)
AST: 23 [IU]/L (ref 0–40)
Albumin: 4 g/dL (ref 3.9–4.9)
Alkaline Phosphatase: 218 [IU]/L — ABNORMAL HIGH (ref 44–121)
BUN/Creatinine Ratio: 21 (ref 12–28)
BUN: 17 mg/dL (ref 8–27)
Bilirubin Total: 0.4 mg/dL (ref 0.0–1.2)
CO2: 26 mmol/L (ref 20–29)
Calcium: 9 mg/dL (ref 8.7–10.3)
Chloride: 103 mmol/L (ref 96–106)
Creatinine, Ser: 0.81 mg/dL (ref 0.57–1.00)
Globulin, Total: 2.3 g/dL (ref 1.5–4.5)
Glucose: 101 mg/dL — ABNORMAL HIGH (ref 70–99)
Potassium: 4.3 mmol/L (ref 3.5–5.2)
Sodium: 141 mmol/L (ref 134–144)
Total Protein: 6.3 g/dL (ref 6.0–8.5)
eGFR: 83 mL/min/{1.73_m2} (ref >59)

## 2023-02-03 LAB — CBC
Hematocrit: 41.6 % (ref 34.0–46.6)
Hemoglobin: 13.2 g/dL (ref 11.1–15.9)
MCH: 28 pg (ref 26.6–33.0)
MCHC: 31.7 g/dL (ref 31.5–35.7)
MCV: 88 fL (ref 79–97)
Platelets: 252 10*3/uL (ref 150–450)
RBC: 4.71 x10E6/uL (ref 3.77–5.28)
RDW: 13 % (ref 11.7–15.4)
WBC: 4.5 10*3/uL (ref 3.4–10.8)

## 2023-02-03 LAB — LIPID PANEL
Chol/HDL Ratio: 2.9 {ratio} (ref 0.0–4.4)
Cholesterol, Total: 192 mg/dL (ref 100–199)
HDL: 67 mg/dL (ref >39)
LDL Chol Calc (NIH): 103 mg/dL — ABNORMAL HIGH (ref 0–99)
Triglycerides: 123 mg/dL (ref 0–149)
VLDL Cholesterol Cal: 22 mg/dL (ref 5–40)

## 2023-02-03 LAB — VITAMIN D 25 HYDROXY (VIT D DEFICIENCY, FRACTURES): Vit D, 25-Hydroxy: 32.7 ng/mL (ref 30.0–100.0)

## 2023-02-03 LAB — HEMOGLOBIN A1C
Est. average glucose Bld gHb Est-mCnc: 111 mg/dL
Hgb A1c MFr Bld: 5.5 % (ref 4.8–5.6)

## 2023-06-21 ENCOUNTER — Ambulatory Visit
Admission: RE | Admit: 2023-06-21 | Discharge: 2023-06-21 | Disposition: A | Discharge status: Home / Self Care | Source: Ambulatory Visit | Attending: Family Medicine | Admitting: Family Medicine

## 2023-06-21 DIAGNOSIS — Z1231 Encounter for screening mammogram for malignant neoplasm of breast: Secondary | ICD-10-CM | POA: Diagnosis not present

## 2023-12-11 ENCOUNTER — Other Ambulatory Visit: Payer: Self-pay | Admitting: Licensed Practical Nurse

## 2023-12-18 ENCOUNTER — Encounter: Payer: Self-pay | Admitting: Family Medicine

## 2023-12-19 ENCOUNTER — Other Ambulatory Visit: Payer: Self-pay

## 2023-12-19 MED ORDER — ESCITALOPRAM OXALATE 10 MG PO TABS: 10.0000 mg | ORAL_TABLET | Freq: Every day | ORAL | 1 refills | Status: DC

## 2024-01-11 ENCOUNTER — Other Ambulatory Visit: Payer: Self-pay | Admitting: Family Medicine

## 2024-02-19 ENCOUNTER — Other Ambulatory Visit: Payer: Self-pay | Admitting: Family Medicine

## 2024-03-02 ENCOUNTER — Encounter: Payer: Self-pay | Admitting: Family Medicine

## 2024-03-05 MED ORDER — ESCITALOPRAM OXALATE 10 MG PO TABS: 10.0000 mg | ORAL_TABLET | Freq: Every day | ORAL | 0 refills | Status: DC

## 2024-03-09 ENCOUNTER — Ambulatory Visit: Admitting: Family Medicine

## 2024-03-09 ENCOUNTER — Encounter: Payer: Self-pay | Admitting: Family Medicine

## 2024-03-09 VITALS — BP 112/64 | HR 56 | Ht 61.0 in | Wt 116.9 lb

## 2024-03-09 DIAGNOSIS — K2210 Ulcer of esophagus without bleeding: Secondary | ICD-10-CM

## 2024-03-09 DIAGNOSIS — L719 Rosacea, unspecified: Secondary | ICD-10-CM

## 2024-03-09 DIAGNOSIS — Z23 Encounter for immunization: Secondary | ICD-10-CM | POA: Diagnosis not present

## 2024-03-09 DIAGNOSIS — R748 Abnormal levels of other serum enzymes: Secondary | ICD-10-CM | POA: Diagnosis not present

## 2024-03-09 DIAGNOSIS — K5901 Slow transit constipation: Secondary | ICD-10-CM | POA: Diagnosis not present

## 2024-03-09 DIAGNOSIS — Z8719 Personal history of other diseases of the digestive system: Secondary | ICD-10-CM | POA: Diagnosis not present

## 2024-03-09 DIAGNOSIS — Z9889 Other specified postprocedural states: Secondary | ICD-10-CM | POA: Diagnosis not present

## 2024-03-09 DIAGNOSIS — E559 Vitamin D deficiency, unspecified: Secondary | ICD-10-CM | POA: Diagnosis not present

## 2024-03-09 DIAGNOSIS — Z1211 Encounter for screening for malignant neoplasm of colon: Secondary | ICD-10-CM

## 2024-03-09 DIAGNOSIS — F411 Generalized anxiety disorder: Secondary | ICD-10-CM | POA: Diagnosis not present

## 2024-03-09 DIAGNOSIS — E7800 Pure hypercholesterolemia, unspecified: Secondary | ICD-10-CM

## 2024-03-09 MED ORDER — ESCITALOPRAM OXALATE 5 MG PO TABS: ORAL_TABLET | ORAL | 0 refills | Status: AC

## 2024-03-09 MED ORDER — ESCITALOPRAM OXALATE 10 MG PO TABS: 10.0000 mg | ORAL_TABLET | Freq: Every day | ORAL | 1 refills | Status: DC

## 2024-03-09 NOTE — Assessment & Plan Note (Signed)
 Vitamin D  deficiency Chronic  - Ordered vitamin D  level to assess current status.

## 2024-03-09 NOTE — Assessment & Plan Note (Signed)
 Rosacea Chronic  Managed with topical ivermectin  1% cream. -continue current management

## 2024-03-09 NOTE — Progress Notes (Signed)
 "  Established Patient Office Visit  Patient ID: Sharon Branch, female    DOB: 1960/07/07  Age: 64 y.o. MRN: [id number] PCP: Sharma Coyer, MD  Chief Complaint  Patient presents with   Medication Refill    Patient is present for a refill on her lexapro .    Anxiety    Patient reports taking medication as prescribed and tolerating well with no symptoms or concerns. She reports anxiety is mild and unchanged since last visit.     Subjective:     HPI  Discussed the use of AI scribe software for clinical note transcription with the patient, who gave verbal consent to proceed.  History of Present Illness Sharon Branch is a 64 year old female who presents for a chronic follow-up.  Her anxiety is mild and stable with no significant changes. She has been taking Lexapro  10 mg daily for five years since her separation. No significant anxiety attacks, crying spells, or feeling down. No significant changes in mood.  She has a history of parasophageal hernia repair, gastric erosion, erosive esophagitis, and slow transient constipation. She experiences spasms and difficulty swallowing, needing to wait for food to go down before continuing to eat. She has lost 11 pounds since December 2024, now weighing 116 pounds, and attributes this to eating less than she used to. She maintains her weight and does not feel underweight. She uses Pepcid  as needed for her symptoms, which helps, but she does not take it daily.  She had an EGD in August 2024, which showed normal biopsies except for an ulcer. She follows up with gastroenterology and is recommended for an updated colonoscopy for colon cancer screening.  She applies topical ivermectin  1% cream for her history of rosacea.   Patient Active Problem List   Diagnosis Date Noted   Annual physical exam 02/02/2023   Slow transit constipation 12/28/2022   Ulcer of esophagus without bleeding 09/30/2022   Erosive esophagitis 09/30/2022   Gastric  erosion 09/30/2022   Esophageal dysphagia 08/12/2022   Vitamin D  deficiency 05/13/2022   Elevated alkaline phosphatase measurement 03/09/2022   Encounter for annual physical examination excluding gynecological examination in a patient older than 17 years 01/07/2022   S/P repair of paraesophageal hernia 10/01/2021   Rosacea 01/14/2020      03/09/2024    8:11 AM 02/02/2023    9:39 AM 09/17/2020    2:30 PM 04/19/2018    1:53 PM  GAD 7 : Generalized Anxiety Score  Nervous, Anxious, on Edge 0 0 0 0  Control/stop worrying 0 0 0 0  Worry too much - different things 0 0 0 1  Trouble relaxing 0 0 0 0  Restless 0 0 0 0  Easily annoyed or irritable 0 0 0 1  Afraid - awful might happen 0 0 0 1  Total GAD 7 Score 0 0 0 3  Anxiety Difficulty Not difficult at all  Not difficult at all Not difficult at all       03/09/2024    8:11 AM 02/02/2023    9:38 AM 12/28/2022    1:13 PM  PHQ9 SCORE ONLY  PHQ-9 Total Score 0 0  0      Data saved with a previous flowsheet row definition     ROS    Objective:     BP 112/64 (Cuff Size: Normal)   Pulse (!) 56   Ht 5' 1 (1.549 m)   Wt 116 lb 14.4 oz (53 kg)   SpO2  100%   BMI 22.09 kg/m  BP Readings from Last 3 Encounters:  03/09/24 112/64  02/02/23 101/61  01/05/23 104/76   Wt Readings from Last 3 Encounters:  03/09/24 116 lb 14.4 oz (53 kg)  02/02/23 127 lb 12.8 oz (58 kg)  01/05/23 127 lb (57.6 kg)      Physical Exam  Physical Exam VITALS: P- 62, BP- 112/62 MEASUREMENTS: Weight- 116, BMI- 22.0. CHEST: Clear to auscultation bilaterally, no wheezes, rhonchi, or crackles. CARDIOVASCULAR: Normal heart rate and rhythm, S1 and S2 normal without murmurs.   No results found for any visits on 03/09/24.  Last CBC Lab Results  Component Value Date   WBC 4.5 02/02/2023   HGB 13.2 02/02/2023   HCT 41.6 02/02/2023   MCV 88 02/02/2023   MCH 28.0 02/02/2023   RDW 13.0 02/02/2023   PLT 252 02/02/2023   Last metabolic panel Lab Results   Component Value Date   GLUCOSE 101 (H) 02/02/2023   NA 141 02/02/2023   K 4.3 02/02/2023   CL 103 02/02/2023   CO2 26 02/02/2023   BUN 17 02/02/2023   CREATININE 0.81 02/02/2023   EGFR 83 02/02/2023   CALCIUM 9.0 02/02/2023   PROT 6.3 02/02/2023   ALBUMIN 4.0 02/02/2023   LABGLOB 2.3 02/02/2023   AGRATIO 2.3 08/12/2022   BILITOT 0.4 02/02/2023   ALKPHOS 218 (H) 02/02/2023   AST 23 02/02/2023   ALT 11 02/02/2023   ANIONGAP 4 (L) 10/02/2021   Last lipids Lab Results  Component Value Date   CHOL 192 02/02/2023   HDL 67 02/02/2023   LDLCALC 103 (H) 02/02/2023   TRIG 123 02/02/2023   CHOLHDL 2.9 02/02/2023   Last hemoglobin A1c Lab Results  Component Value Date   HGBA1C 5.5 02/02/2023   Last thyroid functions No results found for: TSH, T3TOTAL, T4TOTAL, FREET4, THYROIDAB Last vitamin D  Lab Results  Component Value Date   VD25OH 32.7 02/02/2023   Last vitamin B12 and Folate Lab Results  Component Value Date   VITAMINB12 503 08/12/2022      The 10-year ASCVD risk score (Arnett DK, et al., 2019) is: 2.7%    Assessment & Plan:   Problem List Items Addressed This Visit     Elevated alkaline phosphatase measurement   Relevant Orders   CMP14+EGFR   Rosacea   Rosacea Chronic  Managed with topical ivermectin  1% cream. -continue current management       S/P repair of paraesophageal hernia   Slow transit constipation   Ulcer of esophagus without bleeding   Ulcer of esophagus and erosive esophagitis, status post paraesophageal hernia repair Chronic condition  Status post paraesophageal hernia repair with gastric erosion and elevated ALK5. EGD in August 2024 showed normal biopsies. Symptoms managed with as-needed Pepcid . Reports difficulty swallowing and weight loss, possibly related to post-surgical changes or mesh placement. - Continue as-needed Pepcid  for symptom management. - Referred to gastroenterology for colonoscopy for colon cancer  screening. - Advised small, frequent meals to manage swallowing difficulties.      Vitamin D  deficiency   Vitamin D  deficiency Chronic  - Ordered vitamin D  level to assess current status.      Relevant Orders   VITAMIN D  25 Hydroxy (Vit-D Deficiency, Fractures)   Other Visit Diagnoses       GAD (generalized anxiety disorder)    -  Primary   Relevant Medications   escitalopram  (LEXAPRO ) 5 MG tablet     Immunization due  Relevant Orders   Pneumococcal conjugate vaccine 20-valent (Completed)   Tdap vaccine greater than or equal to 7yo IM (Completed)   Varicella-zoster vaccine IM (Completed)     Screening for colon cancer       Relevant Orders   Ambulatory referral to Gastroenterology     Elevated LDL cholesterol level       Relevant Orders   Lipid panel   CMP14+EGFR       Assessment and Plan Assessment & Plan Generalized anxiety disorder Chronic condition with mild anxiety, well-managed on Lexapro  10 mg daily. No significant anxiety attacks or depressive symptoms. Considering tapering off Lexapro  due to stable symptoms and long-term use. - Increased Lexapro  to 20 mg daily to assess symptom control. - Offered buspirone as needed for anxiety. - Provided 5 mg Lexapro  tablets for tapering: 5 mg daily for a week, then half a tablet daily for a week, then half a tablet every other day for a week, then stop. - Instructed to monitor for withdrawal symptoms such as nausea, headaches, diarrhea, or increased anxiety.   Slow transit constipation Chronic  Managed with lifestyle   Elevated LDL cholesterol  Chronic  Previous cholesterol level was 103 mg/dL, slightly above the target of 100 mg/dL. - Ordered lipid panel to reassess cholesterol levels.    General health maintenance Requests pneumococcal, tetanus booster, and varicella vaccines. Recommended COVID vaccine at pharmacy. - Recommended COVID vaccine at pharmacy.    Return in about 6 months (around 09/06/2024) for  CPE.    Rockie Agent, MD [facility]   "

## 2024-03-09 NOTE — Assessment & Plan Note (Signed)
 Ulcer of esophagus and erosive esophagitis, status post paraesophageal hernia repair Chronic condition  Status post paraesophageal hernia repair with gastric erosion and elevated ALK5. EGD in August 2024 showed normal biopsies. Symptoms managed with as-needed Pepcid . Reports difficulty swallowing and weight loss, possibly related to post-surgical changes or mesh placement. - Continue as-needed Pepcid  for symptom management. - Referred to gastroenterology for colonoscopy for colon cancer screening. - Advised small, frequent meals to manage swallowing difficulties.

## 2024-03-09 NOTE — Patient Instructions (Signed)
 Thank you for allowing us  to assist you with proper care. A referral has been placed on your behalf. Our referral coordination team or the office you will be visiting will contact you within the next 10 business days. If you have not received a phone call within 10 business days please let us  know so that we can check into this for you.   [facility]

## 2024-03-10 LAB — CMP14+EGFR
ALT: 13 IU/L (ref 0–32)
AST: 19 IU/L (ref 0–40)
Albumin: 4.1 g/dL (ref 3.9–4.9)
Alkaline Phosphatase: 184 IU/L — ABNORMAL HIGH (ref 49–135)
BUN/Creatinine Ratio: 22 (ref 12–28)
BUN: 14 mg/dL (ref 8–27)
Bilirubin Total: 0.4 mg/dL (ref 0.0–1.2)
CO2: 26 mmol/L (ref 20–29)
Calcium: 8.8 mg/dL (ref 8.7–10.3)
Chloride: 101 mmol/L (ref 96–106)
Creatinine, Ser: 0.64 mg/dL (ref 0.57–1.00)
Globulin, Total: 1.9 g/dL (ref 1.5–4.5)
Glucose: 90 mg/dL (ref 70–99)
Potassium: 4.7 mmol/L (ref 3.5–5.2)
Sodium: 138 mmol/L (ref 134–144)
Total Protein: 6 g/dL (ref 6.0–8.5)
eGFR: 100 mL/min/1.73

## 2024-03-10 LAB — LIPID PANEL
Chol/HDL Ratio: 2.7 ratio (ref 0.0–4.4)
Cholesterol, Total: 167 mg/dL (ref 100–199)
HDL: 63 mg/dL
LDL Chol Calc (NIH): 92 mg/dL (ref 0–99)
Triglycerides: 61 mg/dL (ref 0–149)
VLDL Cholesterol Cal: 12 mg/dL (ref 5–40)

## 2024-03-10 LAB — VITAMIN D 25 HYDROXY (VIT D DEFICIENCY, FRACTURES): Vit D, 25-Hydroxy: 35.8 ng/mL (ref 30.0–100.0)

## 2024-03-11 ENCOUNTER — Ambulatory Visit: Payer: Self-pay | Admitting: Family Medicine

## 2024-03-21 ENCOUNTER — Ambulatory Visit: Admitting: Family Medicine

## 2024-03-21 ENCOUNTER — Encounter: Admitting: Family Medicine

## 2024-04-04 ENCOUNTER — Other Ambulatory Visit: Payer: Self-pay | Admitting: Family Medicine

## 2024-04-04 NOTE — Telephone Encounter (Signed)
 Looks like patient may be tapering off of this med, I was not sure how to fill this or if I should

## 2024-09-10 ENCOUNTER — Encounter: Admitting: Family Medicine
